# Patient Record
Sex: Female | Born: 1980 | Race: White | Hispanic: No | Marital: Married | Smoking: Former smoker
Health system: Southern US, Community
[De-identification: ages and names within clinical notes are randomized; demographics above are authoritative.]

## PROBLEM LIST (undated history)

## (undated) DIAGNOSIS — O09899 Supervision of other high risk pregnancies, unspecified trimester: Secondary | ICD-10-CM

## (undated) DIAGNOSIS — IMO0002 Reserved for concepts with insufficient information to code with codable children: Secondary | ICD-10-CM

## (undated) DIAGNOSIS — G4700 Insomnia, unspecified: Secondary | ICD-10-CM

## (undated) DIAGNOSIS — G8929 Other chronic pain: Secondary | ICD-10-CM

## (undated) DIAGNOSIS — R270 Ataxia, unspecified: Secondary | ICD-10-CM

## (undated) DIAGNOSIS — R2689 Other abnormalities of gait and mobility: Secondary | ICD-10-CM

## (undated) DIAGNOSIS — R1013 Epigastric pain: Secondary | ICD-10-CM

## (undated) DIAGNOSIS — Z8742 Personal history of other diseases of the female genital tract: Secondary | ICD-10-CM

## (undated) DIAGNOSIS — R55 Syncope and collapse: Secondary | ICD-10-CM

## (undated) DIAGNOSIS — F319 Bipolar disorder, unspecified: Secondary | ICD-10-CM

## (undated) DIAGNOSIS — F419 Anxiety disorder, unspecified: Secondary | ICD-10-CM

## (undated) DIAGNOSIS — R87619 Unspecified abnormal cytological findings in specimens from cervix uteri: Secondary | ICD-10-CM

## (undated) DIAGNOSIS — J45909 Unspecified asthma, uncomplicated: Secondary | ICD-10-CM

## (undated) DIAGNOSIS — R519 Headache, unspecified: Secondary | ICD-10-CM

## (undated) HISTORY — DX: Personal history of other diseases of the female genital tract: Z87.42

## (undated) HISTORY — DX: Other chronic pain: G89.29

## (undated) HISTORY — PX: LAPAROSCOPIC REPAIR AND REMOVAL OF GASTRIC BAND: SHX5919

## (undated) HISTORY — DX: Bipolar disorder, unspecified: F31.9

## (undated) HISTORY — DX: Other abnormalities of gait and mobility: R26.89

## (undated) HISTORY — PX: EYE SURGERY: SHX253

## (undated) HISTORY — DX: Insomnia, unspecified: G47.00

## (undated) HISTORY — PX: LAPAROSCOPIC GASTRIC BANDING: SHX1100

## (undated) HISTORY — DX: Epigastric pain: R10.13

## (undated) HISTORY — DX: Ataxia, unspecified: R27.0

## (undated) HISTORY — DX: Syncope and collapse: R55

---

## 2004-03-15 HISTORY — PX: HAND SURGERY: SHX662

## 2004-03-25 ENCOUNTER — Ambulatory Visit (HOSPITAL_COMMUNITY): Admission: RE | Admit: 2004-03-25 | Discharge: 2004-03-25 | Payer: Self-pay | Admitting: Orthopedic Surgery

## 2008-10-23 ENCOUNTER — Ambulatory Visit (HOSPITAL_COMMUNITY): Admission: RE | Admit: 2008-10-23 | Discharge: 2008-10-23 | Payer: Self-pay | Admitting: Obstetrics and Gynecology

## 2008-11-11 ENCOUNTER — Ambulatory Visit (HOSPITAL_COMMUNITY): Admission: RE | Admit: 2008-11-11 | Discharge: 2008-11-11 | Payer: Self-pay | Admitting: Obstetrics and Gynecology

## 2010-04-06 ENCOUNTER — Encounter: Payer: Self-pay | Admitting: Obstetrics and Gynecology

## 2010-07-31 NOTE — Op Note (Signed)
NAMESHAMONE, Monique Floyd             ACCOUNT NO.:  [id number]   MEDICAL RECORD NO.:  [id number]          PATIENT TYPE:  OIB   LOCATION:  NA                           FACILITY:  [facility]   PHYSICIAN:  Artist Pais. Weingold, M.D.DATE OF BIRTH:  1980-05-03   DATE OF PROCEDURE:  03/25/2004  DATE OF DISCHARGE:                                 OPERATIVE REPORT   PREOPERATIVE DIAGNOSIS:  Right hand multiple lacerations to the long, ring,  and small fingers.   POSTOPERATIVE DIAGNOSIS:  Right hand multiple lacerations to the long, ring,  and small fingers.   PROCEDURE:  1.  Right long finger microscopic ulnar digital nerve repair and ulnar      digital artery repair.  2.  Right ring finger, zone 2, flexor digitorum profundus repair with ulnar      and radial digital nerve repair, microscopically.  3.  Right small finger, zone 2, profundus and superficialis repairs as well      as ulnar digital nerve repair microscopically.   SURGEON:  Artist Pais. Mina Marble, M.D.   ASSISTANT:  Aura Fey. Bobbe Medico.   ANESTHESIA:  Ax block.   TOURNIQUET TIME:  One hour and 55 minutes.   COMPLICATIONS:  None.   DRAINS:  None.   OPERATIVE REPORT:  The patient was taken to the operating room with  induction of adequate regional anesthetic. The right upper extremity was  prepped and draped in usual sterile fashion. An Esmarch was used to  exsanguinate the limb. The tourniquet was inflated to 250 mmHg at this point  in time. The long, ring, and small fingers were approached after we had  previously placed sutures in the emergency room. All the incisions were  extended proximally and distally at the mid and lateral type fashion. Flaps  were raised accordingly. Dissection was carried down to the flexor sheath on  the long finger. There was an intact flexor sheath. There was a complete  laceration of the ulnar neurovascular bundle. On the ring finger there was a  profundus laceration in zone 2 as well as complete  laceration of the radial  and ulnar neurovascular bundles. In the little finger there was zone 2  profundus and superficialis lacerations as well as the ulnar neurovascular  bundle. At this point in time the profundus and superficialis tendons of the  small finger were repaired using 3-0 double-armed Ethibond in a Tajima-type  suture, followed by 6-0 Prolene in a tendinous type suture for both the  profundus and superficialis slips. The ring finger profundus tendon was  repaired in a similar fashion. Once this was done the microscope was brought  onto the field, the microscope was then used to repair the ulnar digital  nerve of the little finger, the ulnar and radial digital nerves of the ring  finger, and the ulnar digital nerve and artery on the long finger. The  microscope was removed from the field. The wounds were thoroughly irrigated.  The skin incisions were then closed loosely with 5-0 nylon. The patient  was then placed in a sterile dressing of Xeroform, 4x4's, fluffs, a  compression  wrap, and a dorsal extension block splint with the wrist flexed  to 40 degrees, PIP joints to 30 degrees each. The patient tolerated the  procedure well and went to the recovery room in stable fashion.      MAW/MEDQ  D:  03/25/2004  T:  03/25/2004  Job:  [id number]

## 2011-09-01 ENCOUNTER — Other Ambulatory Visit: Payer: Self-pay

## 2011-09-08 ENCOUNTER — Other Ambulatory Visit (HOSPITAL_COMMUNITY): Payer: Self-pay | Admitting: Obstetrics and Gynecology

## 2011-09-08 DIAGNOSIS — Z3689 Encounter for other specified antenatal screening: Secondary | ICD-10-CM

## 2011-11-01 ENCOUNTER — Encounter (HOSPITAL_COMMUNITY): Payer: Self-pay | Admitting: Obstetrics and Gynecology

## 2011-11-08 ENCOUNTER — Encounter (HOSPITAL_COMMUNITY): Payer: Self-pay

## 2011-11-08 ENCOUNTER — Ambulatory Visit (HOSPITAL_COMMUNITY)
Admission: RE | Admit: 2011-11-08 | Discharge: 2011-11-08 | Disposition: A | Discharge status: Home / Self Care | Payer: Medicaid Other | Source: Ambulatory Visit | Attending: Obstetrics and Gynecology | Admitting: Obstetrics and Gynecology

## 2011-11-08 VITALS — BP 134/85 | HR 89 | Wt 295.0 lb

## 2011-11-08 DIAGNOSIS — Z8751 Personal history of pre-term labor: Secondary | ICD-10-CM | POA: Insufficient documentation

## 2011-11-08 DIAGNOSIS — Z3689 Encounter for other specified antenatal screening: Secondary | ICD-10-CM

## 2011-11-08 DIAGNOSIS — E669 Obesity, unspecified: Secondary | ICD-10-CM | POA: Insufficient documentation

## 2011-11-08 DIAGNOSIS — O34219 Maternal care for unspecified type scar from previous cesarean delivery: Secondary | ICD-10-CM | POA: Insufficient documentation

## 2011-11-08 DIAGNOSIS — O10019 Pre-existing essential hypertension complicating pregnancy, unspecified trimester: Secondary | ICD-10-CM | POA: Insufficient documentation

## 2011-11-08 DIAGNOSIS — Z363 Encounter for antenatal screening for malformations: Secondary | ICD-10-CM | POA: Insufficient documentation

## 2011-11-08 DIAGNOSIS — O358XX0 Maternal care for other (suspected) fetal abnormality and damage, not applicable or unspecified: Secondary | ICD-10-CM | POA: Insufficient documentation

## 2011-11-08 DIAGNOSIS — Z1389 Encounter for screening for other disorder: Secondary | ICD-10-CM | POA: Insufficient documentation

## 2011-11-08 HISTORY — DX: Unspecified asthma, uncomplicated: J45.909

## 2011-11-08 HISTORY — DX: Unspecified abnormal cytological findings in specimens from cervix uteri: R87.619

## 2011-11-08 HISTORY — DX: Reserved for concepts with insufficient information to code with codable children: IMO0002

## 2011-11-08 HISTORY — DX: Anxiety disorder, unspecified: F41.9

## 2011-11-08 HISTORY — DX: Supervision of other high risk pregnancies, unspecified trimester: O09.899

## 2011-11-08 NOTE — Progress Notes (Signed)
Monique Floyd  was seen today for an ultrasound appointment.  See full report in [facility].  Alpha Gula, MD  Single IUP at 20 0/7 weeks Normal detailed fetal anatomic survey No markers associated with aneuploidy noted Normal amniotic fluid volume  Recommend follow up ultrasound in 6 weeks for interval growth.

## 2011-12-20 ENCOUNTER — Ambulatory Visit (HOSPITAL_COMMUNITY)
Admission: RE | Admit: 2011-12-20 | Discharge: 2011-12-20 | Disposition: A | Discharge status: Home / Self Care | Payer: Medicaid Other | Source: Ambulatory Visit | Attending: Obstetrics and Gynecology | Admitting: Obstetrics and Gynecology

## 2011-12-20 VITALS — BP 141/84 | HR 100 | Wt 291.0 lb

## 2011-12-20 DIAGNOSIS — Z8751 Personal history of pre-term labor: Secondary | ICD-10-CM | POA: Insufficient documentation

## 2011-12-20 DIAGNOSIS — E669 Obesity, unspecified: Secondary | ICD-10-CM | POA: Insufficient documentation

## 2011-12-20 DIAGNOSIS — O09299 Supervision of pregnancy with other poor reproductive or obstetric history, unspecified trimester: Secondary | ICD-10-CM | POA: Insufficient documentation

## 2011-12-20 DIAGNOSIS — O34219 Maternal care for unspecified type scar from previous cesarean delivery: Secondary | ICD-10-CM | POA: Insufficient documentation

## 2011-12-20 DIAGNOSIS — O99210 Obesity complicating pregnancy, unspecified trimester: Secondary | ICD-10-CM | POA: Insufficient documentation

## 2011-12-20 DIAGNOSIS — Z3689 Encounter for other specified antenatal screening: Secondary | ICD-10-CM

## 2011-12-20 DIAGNOSIS — O10019 Pre-existing essential hypertension complicating pregnancy, unspecified trimester: Secondary | ICD-10-CM

## 2012-01-17 ENCOUNTER — Ambulatory Visit (HOSPITAL_COMMUNITY)
Admission: RE | Admit: 2012-01-17 | Discharge: 2012-01-17 | Disposition: A | Discharge status: Home / Self Care | Payer: Medicaid Other | Source: Ambulatory Visit | Attending: Obstetrics and Gynecology | Admitting: Obstetrics and Gynecology

## 2012-01-17 ENCOUNTER — Encounter (HOSPITAL_COMMUNITY): Payer: Self-pay

## 2012-01-17 DIAGNOSIS — E669 Obesity, unspecified: Secondary | ICD-10-CM | POA: Insufficient documentation

## 2012-01-17 DIAGNOSIS — O09299 Supervision of pregnancy with other poor reproductive or obstetric history, unspecified trimester: Secondary | ICD-10-CM | POA: Insufficient documentation

## 2012-01-17 DIAGNOSIS — O10019 Pre-existing essential hypertension complicating pregnancy, unspecified trimester: Secondary | ICD-10-CM

## 2012-01-17 DIAGNOSIS — Z8751 Personal history of pre-term labor: Secondary | ICD-10-CM | POA: Insufficient documentation

## 2012-01-17 DIAGNOSIS — O34219 Maternal care for unspecified type scar from previous cesarean delivery: Secondary | ICD-10-CM | POA: Insufficient documentation

## 2012-01-17 NOTE — Progress Notes (Signed)
[facility] Ultrasound  Indication: 31 yr old [id number] at [redacted]w[redacted]d with chronic hypertension for follow up fetal growth ultrasound.  Findings: 1. Single intrauterine pregnancy. 2. Estimated fetal weight is in the 88th%. 3. Anterior, fundal placenta without evidence of previa. 4. Normal amniotic fluid index; although increased for gestational age. 5. The limited anatomy survey is normal. Any anatomy not evaluated on today's exam was evaluated on the previous exam.  Recommendations: 1. Appropriate fetal growth; although growth and abdominal circumference are accelerated. 2. Hypertension: - previously counseled - well controlled on aldomet - recommend fetal growth every 4 weeks - recommend antenatal testing with either twice weekly nonstress tests and weekly amniotic fluid index or weekly biophysical profiles starting at 32 weeks - recommend close surveillance for the development of signs/symptoms of preeclampsia - recommend delivery at 38-[redacted] weeks gestation 3. Slightly increased AFI: - recommend repeat AFI in 1 week 4. Recommend fetal kick counts.  Eulis Foster, MD

## 2012-01-24 ENCOUNTER — Ambulatory Visit (HOSPITAL_COMMUNITY)
Admission: RE | Admit: 2012-01-24 | Discharge: 2012-01-24 | Disposition: A | Discharge status: Home / Self Care | Payer: Medicaid Other | Source: Ambulatory Visit | Attending: Obstetrics and Gynecology | Admitting: Obstetrics and Gynecology

## 2012-01-24 DIAGNOSIS — O09299 Supervision of pregnancy with other poor reproductive or obstetric history, unspecified trimester: Secondary | ICD-10-CM | POA: Insufficient documentation

## 2012-01-24 DIAGNOSIS — Z8751 Personal history of pre-term labor: Secondary | ICD-10-CM | POA: Insufficient documentation

## 2012-01-24 DIAGNOSIS — O10019 Pre-existing essential hypertension complicating pregnancy, unspecified trimester: Secondary | ICD-10-CM

## 2012-01-24 DIAGNOSIS — O34219 Maternal care for unspecified type scar from previous cesarean delivery: Secondary | ICD-10-CM | POA: Insufficient documentation

## 2012-01-24 DIAGNOSIS — E669 Obesity, unspecified: Secondary | ICD-10-CM | POA: Insufficient documentation

## 2012-01-24 DIAGNOSIS — O99210 Obesity complicating pregnancy, unspecified trimester: Secondary | ICD-10-CM | POA: Insufficient documentation

## 2012-01-24 NOTE — Progress Notes (Signed)
Monique Floyd  was seen today for an ultrasound appointment.  See full report in [facility].  Single IUP at 31 0/7 weeks Limited ultrasound performed for AFI only Mild polyhydramnios noted with AFI of 27 cm.  Recommend initiating 2x weekly NSTs with weekly AFI beginning at 32 weeks. Follow up growth scan in 4 weeks.  Alpha Gula, MD

## 2012-02-14 ENCOUNTER — Ambulatory Visit (HOSPITAL_COMMUNITY)
Admission: RE | Admit: 2012-02-14 | Discharge: 2012-02-14 | Disposition: A | Discharge status: Home / Self Care | Payer: Medicaid Other | Source: Ambulatory Visit | Attending: Obstetrics and Gynecology | Admitting: Obstetrics and Gynecology

## 2012-02-14 ENCOUNTER — Other Ambulatory Visit (HOSPITAL_COMMUNITY): Payer: Self-pay | Admitting: Maternal and Fetal Medicine

## 2012-02-14 DIAGNOSIS — O09299 Supervision of pregnancy with other poor reproductive or obstetric history, unspecified trimester: Secondary | ICD-10-CM | POA: Insufficient documentation

## 2012-02-14 DIAGNOSIS — E669 Obesity, unspecified: Secondary | ICD-10-CM | POA: Insufficient documentation

## 2012-02-14 DIAGNOSIS — O409XX0 Polyhydramnios, unspecified trimester, not applicable or unspecified: Secondary | ICD-10-CM | POA: Insufficient documentation

## 2012-02-14 DIAGNOSIS — Z8751 Personal history of pre-term labor: Secondary | ICD-10-CM | POA: Insufficient documentation

## 2012-02-14 DIAGNOSIS — O99210 Obesity complicating pregnancy, unspecified trimester: Secondary | ICD-10-CM | POA: Insufficient documentation

## 2012-02-14 DIAGNOSIS — O10019 Pre-existing essential hypertension complicating pregnancy, unspecified trimester: Secondary | ICD-10-CM

## 2012-02-14 DIAGNOSIS — O34219 Maternal care for unspecified type scar from previous cesarean delivery: Secondary | ICD-10-CM | POA: Insufficient documentation

## 2012-02-14 NOTE — Progress Notes (Signed)
Monique Floyd  was seen today for an ultrasound appointment.  See full report in [facility].  Alpha Gula, MD

## 2012-03-07 ENCOUNTER — Ambulatory Visit (HOSPITAL_COMMUNITY)
Admission: RE | Admit: 2012-03-07 | Discharge: 2012-03-07 | Disposition: A | Discharge status: Home / Self Care | Payer: Medicaid Other | Source: Ambulatory Visit | Attending: Obstetrics and Gynecology | Admitting: Obstetrics and Gynecology

## 2012-03-07 ENCOUNTER — Encounter (HOSPITAL_COMMUNITY): Payer: Self-pay

## 2012-03-07 DIAGNOSIS — E669 Obesity, unspecified: Secondary | ICD-10-CM | POA: Insufficient documentation

## 2012-03-07 DIAGNOSIS — Z8751 Personal history of pre-term labor: Secondary | ICD-10-CM | POA: Insufficient documentation

## 2012-03-07 DIAGNOSIS — O10019 Pre-existing essential hypertension complicating pregnancy, unspecified trimester: Secondary | ICD-10-CM

## 2012-03-07 DIAGNOSIS — O3660X0 Maternal care for excessive fetal growth, unspecified trimester, not applicable or unspecified: Secondary | ICD-10-CM | POA: Insufficient documentation

## 2012-03-07 DIAGNOSIS — O09299 Supervision of pregnancy with other poor reproductive or obstetric history, unspecified trimester: Secondary | ICD-10-CM | POA: Insufficient documentation

## 2012-03-07 DIAGNOSIS — O34219 Maternal care for unspecified type scar from previous cesarean delivery: Secondary | ICD-10-CM | POA: Insufficient documentation

## 2014-01-14 ENCOUNTER — Encounter (HOSPITAL_COMMUNITY): Payer: Self-pay

## 2017-03-15 HISTORY — PX: CHOLECYSTECTOMY: SHX55

## 2017-07-07 HISTORY — DX: Morbid (severe) obesity due to excess calories: E66.01

## 2018-01-24 DIAGNOSIS — Z9884 Bariatric surgery status: Secondary | ICD-10-CM | POA: Insufficient documentation

## 2018-01-24 DIAGNOSIS — Z6841 Body Mass Index (BMI) 40.0 and over, adult: Secondary | ICD-10-CM

## 2018-01-24 HISTORY — DX: Bariatric surgery status: Z98.84

## 2018-01-24 HISTORY — DX: Body Mass Index (BMI) 40.0 and over, adult: Z684

## 2020-07-21 ENCOUNTER — Emergency Department (HOSPITAL_COMMUNITY): Payer: BC Managed Care – PPO

## 2020-07-21 ENCOUNTER — Inpatient Hospital Stay (HOSPITAL_COMMUNITY)
Admission: EM | Admit: 2020-07-21 | Discharge: 2020-07-31 | DRG: 392 | Disposition: A | Discharge status: Home / Self Care | Payer: BC Managed Care – PPO | Attending: Internal Medicine | Admitting: Internal Medicine

## 2020-07-21 DIAGNOSIS — Z91041 Radiographic dye allergy status: Secondary | ICD-10-CM

## 2020-07-21 DIAGNOSIS — Z885 Allergy status to narcotic agent status: Secondary | ICD-10-CM

## 2020-07-21 DIAGNOSIS — K589 Irritable bowel syndrome without diarrhea: Secondary | ICD-10-CM | POA: Diagnosis not present

## 2020-07-21 DIAGNOSIS — R1013 Epigastric pain: Secondary | ICD-10-CM | POA: Diagnosis not present

## 2020-07-21 DIAGNOSIS — Z20822 Contact with and (suspected) exposure to covid-19: Secondary | ICD-10-CM | POA: Diagnosis present

## 2020-07-21 DIAGNOSIS — Z91030 Bee allergy status: Secondary | ICD-10-CM

## 2020-07-21 DIAGNOSIS — Z9884 Bariatric surgery status: Secondary | ICD-10-CM

## 2020-07-21 DIAGNOSIS — N390 Urinary tract infection, site not specified: Secondary | ICD-10-CM | POA: Diagnosis present

## 2020-07-21 DIAGNOSIS — R7989 Other specified abnormal findings of blood chemistry: Secondary | ICD-10-CM

## 2020-07-21 DIAGNOSIS — Z800 Family history of malignant neoplasm of digestive organs: Secondary | ICD-10-CM

## 2020-07-21 DIAGNOSIS — R109 Unspecified abdominal pain: Principal | ICD-10-CM | POA: Diagnosis present

## 2020-07-21 DIAGNOSIS — F419 Anxiety disorder, unspecified: Secondary | ICD-10-CM | POA: Diagnosis present

## 2020-07-21 DIAGNOSIS — Z87891 Personal history of nicotine dependence: Secondary | ICD-10-CM

## 2020-07-21 DIAGNOSIS — G4733 Obstructive sleep apnea (adult) (pediatric): Secondary | ICD-10-CM | POA: Diagnosis present

## 2020-07-21 DIAGNOSIS — R7401 Elevation of levels of liver transaminase levels: Secondary | ICD-10-CM | POA: Diagnosis present

## 2020-07-21 DIAGNOSIS — Z9049 Acquired absence of other specified parts of digestive tract: Secondary | ICD-10-CM

## 2020-07-21 DIAGNOSIS — M170 Bilateral primary osteoarthritis of knee: Secondary | ICD-10-CM | POA: Diagnosis present

## 2020-07-21 DIAGNOSIS — E876 Hypokalemia: Secondary | ICD-10-CM | POA: Diagnosis present

## 2020-07-21 DIAGNOSIS — K921 Melena: Secondary | ICD-10-CM | POA: Diagnosis not present

## 2020-07-21 DIAGNOSIS — Z79899 Other long term (current) drug therapy: Secondary | ICD-10-CM

## 2020-07-21 HISTORY — DX: Unspecified abdominal pain: R10.9

## 2020-07-21 LAB — COMPREHENSIVE METABOLIC PANEL
ALT: 34 U/L (ref 0–44)
AST: 39 U/L (ref 15–41)
Albumin: 4.2 g/dL (ref 3.5–5.0)
Alkaline Phosphatase: 77 U/L (ref 38–126)
Anion gap: 9 (ref 5–15)
BUN: 8 mg/dL (ref 6–20)
CO2: 22 mmol/L (ref 22–32)
Calcium: 9 mg/dL (ref 8.9–10.3)
Chloride: 109 mmol/L (ref 98–111)
Creatinine, Ser: 0.64 mg/dL (ref 0.44–1.00)
GFR, Estimated: >60 mL/min (ref >60)
Glucose, Bld: 89 mg/dL (ref 70–99)
Potassium: 4.3 mmol/L (ref 3.5–5.1)
Sodium: 140 mmol/L (ref 135–145)
Total Bilirubin: 1.3 mg/dL — ABNORMAL HIGH (ref 0.3–1.2)
Total Protein: 6.7 g/dL (ref 6.5–8.1)

## 2020-07-21 LAB — CBC WITH DIFFERENTIAL/PLATELET
Abs Immature Granulocytes: 0.02 10*3/uL (ref 0.00–0.07)
Basophils Absolute: 0.1 10*3/uL (ref 0.0–0.1)
Basophils Relative: 1 %
Eosinophils Absolute: 0.1 10*3/uL (ref 0.0–0.5)
Eosinophils Relative: 1 %
HCT: 47.7 % — ABNORMAL HIGH (ref 36.0–46.0)
Hemoglobin: 15.6 g/dL — ABNORMAL HIGH (ref 12.0–15.0)
Immature Granulocytes: 0 %
Lymphocytes Relative: 32 %
Lymphs Abs: 2.5 10*3/uL (ref 0.7–4.0)
MCH: 32.5 pg (ref 26.0–34.0)
MCHC: 32.7 g/dL (ref 30.0–36.0)
MCV: 99.4 fL (ref 80.0–100.0)
Monocytes Absolute: 0.6 10*3/uL (ref 0.1–1.0)
Monocytes Relative: 8 %
Neutro Abs: 4.5 10*3/uL (ref 1.7–7.7)
Neutrophils Relative %: 58 %
Platelets: 263 10*3/uL (ref 150–400)
RBC: 4.8 MIL/uL (ref 3.87–5.11)
RDW: 12.6 % (ref 11.5–15.5)
WBC: 7.7 10*3/uL (ref 4.0–10.5)
nRBC: 0 % (ref 0.0–0.2)

## 2020-07-21 LAB — URINALYSIS, ROUTINE W REFLEX MICROSCOPIC
Bilirubin Urine: NEGATIVE
Glucose, UA: NEGATIVE mg/dL
Hgb urine dipstick: NEGATIVE
Ketones, ur: 20 mg/dL — AB
Nitrite: NEGATIVE
Protein, ur: NEGATIVE mg/dL
Specific Gravity, Urine: 1.02 (ref 1.005–1.030)
pH: 5 (ref 5.0–8.0)

## 2020-07-21 LAB — PREGNANCY, URINE: Preg Test, Ur: NEGATIVE

## 2020-07-21 LAB — TROPONIN I (HIGH SENSITIVITY): Troponin I (High Sensitivity): 4 ng/L (ref <18)

## 2020-07-21 LAB — RESP PANEL BY RT-PCR (FLU A&B, COVID) ARPGX2
Influenza A by PCR: NEGATIVE
Influenza B by PCR: NEGATIVE
SARS Coronavirus 2 by RT PCR: NEGATIVE

## 2020-07-21 LAB — LACTIC ACID, PLASMA: Lactic Acid, Venous: 0.7 mmol/L (ref 0.5–1.9)

## 2020-07-21 LAB — LIPASE, BLOOD: Lipase: 32 U/L (ref 11–51)

## 2020-07-21 MED ORDER — HYDROMORPHONE HCL 1 MG/ML IJ SOLN
1.0000 mg | Freq: Once | INTRAMUSCULAR | Status: AC
Start: 2020-07-21 — End: 2020-07-21
  Administered 2020-07-21: 1 mg via INTRAVENOUS
  Filled 2020-07-21: qty 1

## 2020-07-21 MED ORDER — MELATONIN 3 MG PO TABS
3.0000 mg | ORAL_TABLET | Freq: Every evening | ORAL | Status: DC | PRN
  Administered 2020-07-21 – 2020-07-27 (×6): 3 mg via ORAL
  Filled 2020-07-21 (×6): qty 1

## 2020-07-21 MED ORDER — HYDROMORPHONE HCL 1 MG/ML IJ SOLN
1.0000 mg | INTRAMUSCULAR | Status: DC | PRN
  Administered 2020-07-21: 1 mg via INTRAVENOUS
  Filled 2020-07-21 (×2): qty 1

## 2020-07-21 MED ORDER — LACTATED RINGERS IV BOLUS
1000.0000 mL | Freq: Once | INTRAVENOUS | Status: AC
  Administered 2020-07-21: 1000 mL via INTRAVENOUS

## 2020-07-21 MED ORDER — SODIUM CHLORIDE 0.9 % IV BOLUS
1000.0000 mL | Freq: Once | INTRAVENOUS | Status: AC
  Administered 2020-07-21: 1000 mL via INTRAVENOUS

## 2020-07-21 MED ORDER — DIPHENHYDRAMINE HCL 25 MG PO CAPS: 50.0000 mg | ORAL_CAPSULE | Freq: Once | ORAL | Status: DC

## 2020-07-21 MED ORDER — SENNOSIDES-DOCUSATE SODIUM 8.6-50 MG PO TABS
2.0000 | ORAL_TABLET | Freq: Every day | ORAL | Status: DC
  Administered 2020-07-21 – 2020-07-30 (×9): 2 via ORAL
  Filled 2020-07-21 (×10): qty 2

## 2020-07-21 MED ORDER — CEPHALEXIN 500 MG PO CAPS
500.0000 mg | ORAL_CAPSULE | Freq: Three times a day (TID) | ORAL | Status: AC
  Administered 2020-07-21 – 2020-07-26 (×15): 500 mg via ORAL
  Filled 2020-07-21 (×13): qty 1
  Filled 2020-07-21: qty 2
  Filled 2020-07-21: qty 1

## 2020-07-21 MED ORDER — MORPHINE SULFATE (PF) 4 MG/ML IV SOLN
4.0000 mg | Freq: Once | INTRAVENOUS | Status: AC
  Administered 2020-07-21: 4 mg via INTRAVENOUS
  Filled 2020-07-21: qty 1

## 2020-07-21 MED ORDER — PANTOPRAZOLE SODIUM 40 MG IV SOLR
40.0000 mg | Freq: Two times a day (BID) | INTRAVENOUS | Status: DC
  Administered 2020-07-21 – 2020-07-23 (×5): 40 mg via INTRAVENOUS
  Filled 2020-07-21 (×5): qty 40

## 2020-07-21 MED ORDER — ACETAMINOPHEN 325 MG PO TABS
650.0000 mg | ORAL_TABLET | Freq: Four times a day (QID) | ORAL | Status: DC | PRN
  Administered 2020-07-23 – 2020-07-29 (×14): 650 mg via ORAL
  Filled 2020-07-21 (×15): qty 2

## 2020-07-21 MED ORDER — DICYCLOMINE HCL 10 MG PO CAPS
10.0000 mg | ORAL_CAPSULE | Freq: Three times a day (TID) | ORAL | Status: AC
  Administered 2020-07-21 – 2020-07-23 (×7): 10 mg via ORAL
  Filled 2020-07-21 (×6): qty 1

## 2020-07-21 MED ORDER — LIDOCAINE VISCOUS HCL 2 % MT SOLN
15.0000 mL | Freq: Once | OROMUCOSAL | Status: AC
  Administered 2020-07-21: 15 mL via ORAL
  Filled 2020-07-21: qty 15

## 2020-07-21 MED ORDER — HYDROCORTISONE NA SUCCINATE PF 250 MG IJ SOLR
200.0000 mg | Freq: Once | INTRAMUSCULAR | Status: AC
  Administered 2020-07-21: 200 mg via INTRAVENOUS
  Filled 2020-07-21: qty 200

## 2020-07-21 MED ORDER — ENOXAPARIN SODIUM 40 MG/0.4ML IJ SOSY
40.0000 mg | PREFILLED_SYRINGE | INTRAMUSCULAR | Status: DC
  Administered 2020-07-21 – 2020-07-22 (×2): 40 mg via SUBCUTANEOUS
  Filled 2020-07-21 (×2): qty 0.4

## 2020-07-21 MED ORDER — OXYCODONE HCL 5 MG PO TABS
5.0000 mg | ORAL_TABLET | ORAL | Status: DC | PRN
  Administered 2020-07-21 – 2020-07-27 (×30): 5 mg via ORAL
  Filled 2020-07-21 (×33): qty 1

## 2020-07-21 MED ORDER — DIPHENHYDRAMINE HCL 50 MG/ML IJ SOLN: 50.0000 mg | Freq: Once | INTRAMUSCULAR | Status: DC

## 2020-07-21 MED ORDER — ONDANSETRON HCL 4 MG/2ML IJ SOLN
4.0000 mg | Freq: Four times a day (QID) | INTRAMUSCULAR | Status: DC | PRN
  Administered 2020-07-21 – 2020-07-25 (×3): 4 mg via INTRAVENOUS
  Filled 2020-07-21 (×4): qty 2

## 2020-07-21 MED ORDER — ALUM & MAG HYDROXIDE-SIMETH 200-200-20 MG/5ML PO SUSP
30.0000 mL | Freq: Once | ORAL | Status: AC
  Administered 2020-07-21: 30 mL via ORAL
  Filled 2020-07-21: qty 30

## 2020-07-21 MED ORDER — IPRATROPIUM-ALBUTEROL 0.5-2.5 (3) MG/3ML IN SOLN
3.0000 mL | Freq: Once | RESPIRATORY_TRACT | Status: AC
  Administered 2020-07-21: 3 mL via RESPIRATORY_TRACT
  Filled 2020-07-21: qty 3

## 2020-07-21 MED ORDER — PANTOPRAZOLE SODIUM 40 MG IV SOLR
40.0000 mg | Freq: Once | INTRAVENOUS | Status: AC
  Administered 2020-07-21: 40 mg via INTRAVENOUS
  Filled 2020-07-21: qty 40

## 2020-07-21 MED ORDER — HYDROMORPHONE HCL 1 MG/ML IJ SOLN
1.0000 mg | Freq: Once | INTRAMUSCULAR | Status: AC
  Administered 2020-07-21: 1 mg via INTRAVENOUS
  Filled 2020-07-21: qty 1

## 2020-07-21 MED ORDER — LACTATED RINGERS IV SOLN: INTRAVENOUS | Status: AC

## 2020-07-21 MED ORDER — ONDANSETRON HCL 4 MG/2ML IJ SOLN
4.0000 mg | Freq: Once | INTRAMUSCULAR | Status: AC
Start: 2020-07-21 — End: 2020-07-21
  Administered 2020-07-21: 4 mg via INTRAVENOUS
  Filled 2020-07-21: qty 2

## 2020-07-21 MED ORDER — FENTANYL CITRATE (PF) 100 MCG/2ML IJ SOLN
100.0000 ug | Freq: Once | INTRAMUSCULAR | Status: DC
Start: 2020-07-21 — End: 2020-07-21

## 2020-07-21 NOTE — ED Provider Notes (Signed)
[facility] EMERGENCY DEPARTMENT Provider Note   CSN: [id number] Arrival date & time: 07/21/20  0845     History Chief Complaint  Patient presents with  . Abdominal Pain    Monique Floyd is a 40 y.o. female.  40 year old female with extensive surgical history including Laparoscopic Biliopancreatic Diversion with Duodenal Switch, laparoscopic removal of lap band and port, laparoscopic cholecystectomy, laparoscopic repair of DI leak and graham patch, as well as asthma, who p/w abd pain.  This morning, patient suddenly began having generalized abdominal pain and burning sensation associated with some pain radiating to bilateral flanks and back.  She was in her usual state of health yesterday and denies any recent illness.  No associated nausea, vomiting, or change in bowel movements.  No fevers.  She denies any chest pain or shortness of breath.  EMS gave her 100 mcg of fentanyl in route.  She has not had any surgical problems recently and does not take any medications daily.  She denies alcohol or heavy NSAID use.    The history is provided by the patient.  Abdominal Pain      Past Medical History:  Diagnosis Date  . Abnormal Pap smear   . Anxiety   . Asthma   . Prior pregnancy complicated by PIH, antepartum     There are no problems to display for this patient.   Past Surgical History:  Procedure Laterality Date  . CESAREAN SECTION       OB History    Gravida  6   Para  4   Term  3   Preterm  1   AB  1   Living  4     SAB  1   IAB  0   Ectopic  0   Multiple  1   Live Births  5           No family history on file.  Social History   Tobacco Use  . Smoking status: Former Smoker    Quit date: 06/14/2011    Years since quitting: 9.1    Home Medications Prior to Admission medications   Medication Sig Start Date End Date Taking? Authorizing Provider  methyldopa (ALDOMET) 500 MG tablet Take 500 mg by mouth 2 (two) times  daily.    [provider]  Prenatal Vit w/Fe-Methylfol-FA (PNV PO) Take by mouth daily.    [provider]    Allergies    Contrast media [iodinated diagnostic agents] and Codeine  Review of Systems   Review of Systems  Gastrointestinal: Positive for abdominal pain.   All other systems reviewed and are negative except that which was mentioned in HPI  Physical Exam Updated Vital Signs BP (!) 96/51   Pulse 63   Temp 98.6 F (37 C) (Oral)   Resp (!) 22   Ht 5\' 1"  (1.549 m)   Wt 65.8 kg   SpO2 97%   BMI 27.40 kg/m   Physical Exam Vitals and nursing note reviewed.  Constitutional:      General: She is not in acute distress.    Appearance: Normal appearance.     Comments: Uncomfortable, in mild distress due to pain  HENT:     Head: Normocephalic and atraumatic.  Eyes:     Conjunctiva/sclera: Conjunctivae normal.  Cardiovascular:     Rate and Rhythm: Normal rate and regular rhythm.     Heart sounds: Normal heart sounds. No murmur heard.   Pulmonary:  Effort: Pulmonary effort is normal.     Comments: Expiratory wheezes bilaterally Abdominal:     General: Abdomen is flat. Bowel sounds are normal. There is no distension.     Palpations: Abdomen is soft.     Tenderness: There is generalized abdominal tenderness.     Comments: Generalized abdominal tenderness worse in upper abdomen, no peritonitis or rigidity  Musculoskeletal:     Right lower leg: No edema.     Left lower leg: No edema.  Skin:    General: Skin is warm and dry.  Neurological:     Mental Status: She is alert and oriented to person, place, and time.     Comments: fluent  Psychiatric:        Mood and Affect: Mood is anxious.        Behavior: Behavior normal.     ED Results / Procedures / Treatments   Labs (all labs ordered are listed, but only abnormal results are displayed) Labs Reviewed  COMPREHENSIVE METABOLIC PANEL - Abnormal; Notable for the following components:       Result Value   Total Bilirubin 1.3 (*)    All other components within normal limits  CBC WITH DIFFERENTIAL/PLATELET - Abnormal; Notable for the following components:   Hemoglobin 15.6 (*)    HCT 47.7 (*)    All other components within normal limits  URINALYSIS, ROUTINE W REFLEX MICROSCOPIC - Abnormal; Notable for the following components:   Color, Urine AMBER (*)    APPearance HAZY (*)    Ketones, ur 20 (*)    Leukocytes,Ua MODERATE (*)    Bacteria, UA RARE (*)    All other components within normal limits  URINE CULTURE  RESP PANEL BY RT-PCR (FLU A&B, COVID) ARPGX2  LIPASE, BLOOD  PREGNANCY, URINE  LACTIC ACID, PLASMA  TROPONIN I (HIGH SENSITIVITY)    EKG None  Radiology CT Abdomen Pelvis Wo Contrast  Result Date: 07/21/2020 CLINICAL DATA:  Abdominal pain. Patient reports a history of stones. History of a cholecystectomy. EXAM: CT ABDOMEN AND PELVIS WITHOUT CONTRAST TECHNIQUE: Multidetector CT imaging of the abdomen and pelvis was performed following the standard protocol without IV contrast. COMPARISON:  None. FINDINGS: Lower chest: Clear lung bases.  Heart normal in size. Hepatobiliary: Liver normal in overall size attenuation. There is a focal area of hypoattenuation, 3 cm in size, inferior aspect of segment 4 B, likely an area of focal fat. There is no mass effect from this. No other liver lesions. Status post cholecystectomy. No bile duct dilation. Pancreas: Choose 1 Spleen: Normal in size without focal abnormality. Adrenals/Urinary Tract: Adrenal glands are unremarkable. Kidneys are normal, without renal calculi, focal lesion, or hydronephrosis. Bladder is unremarkable. 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 No stomach wall thickening or inflammation. Small bowel and colon are normal in caliber. No wall thickening. No inflammatory changes. Normal appendix visualized. Vascular/Lymphatic: Mild aortic  atherosclerosis. No enlarged lymph nodes. Reproductive: Uterus and bilateral adnexa are unremarkable. Other: No abdominal wall hernia or abnormality. No abdominopelvic ascites. Musculoskeletal: No fracture or acute finding.  No bone lesion. IMPRESSION: 1. No acute findings within the abdomen or pelvis. 2. Changes consistent with previous gastric bypass surgery as well as a prior cholecystectomy. 3. 3 cm low-attenuation area in the inferior aspect the liver, segment 4 B, likely due to focal fat. This could reflect the sequela from previous gallbladder surgery. Consider further assessment with liver ultrasound or, preferably, liver MRI without  and with contrast. 4. Mild aortic atherosclerosis. Electronically Signed   By: Amie Portland M.D.   On: 07/21/2020 12:01    Procedures Procedures   Medications Ordered in ED Medications  ondansetron (ZOFRAN) injection 4 mg (4 mg Intravenous Given 07/21/20 1008)  morphine 4 MG/ML injection 4 mg (4 mg Intravenous Given 07/21/20 1008)  lactated ringers bolus 1,000 mL (0 mLs Intravenous Stopped 07/21/20 1107)  ipratropium-albuterol (DUONEB) 0.5-2.5 (3) MG/3ML nebulizer solution 3 mL (3 mLs Nebulization Given 07/21/20 1106)  HYDROmorphone (DILAUDID) injection 1 mg (1 mg Intravenous Given 07/21/20 1102)  HYDROmorphone (DILAUDID) injection 1 mg (1 mg Intravenous Given 07/21/20 1204)  HYDROmorphone (DILAUDID) injection 1 mg (1 mg Intravenous Given 07/21/20 1242)  hydrocortisone sodium succinate (SOLU-CORTEF) injection 200 mg (200 mg Intravenous Given 07/21/20 1258)  sodium chloride 0.9 % bolus 1,000 mL (1,000 mLs Intravenous New Bag/Given 07/21/20 1334)    ED Course  I have reviewed the triage vital signs and the nursing notes.  Pertinent labs & imaging results that were available during my care of the patient were reviewed by me and considered in my medical decision making (see chart for details).    MDM Rules/Calculators/A&P                          Patient was in mild  distress due to pain on initial exam.  Vital signs reassuring.  She did have some wheezing on exam, denies any symptoms suggestive of asthma exacerbation but does note some seasonal allergy symptoms recently.  Gave pain medicine as well as DuoNeb. Labwork including LFTs and lipase is normal.  Because of her extensive surgical history, differential is broad and includes bowel obstruction, anastomotic leak, pancreatitis.  Obtained CT abd/pelvis without contrast as pt reports anaphylactic allergy to contrast. CT with no acute findings to explain sx. Added EKG and trop to screen for ACS; trop and EKG reassuring. PT has continued to have severe pain requiring multiple rounds of dilaudid. Concern for possible dissection given radiation to back, sudden onset, and persistent pain. Radiologist recommended MRA with contrast to evaluate for dissection, which I have ordered. PT signed out pending imaging results.  Final Clinical Impression(s) / ED Diagnoses Final diagnoses:  None    Rx / DC Orders ED Discharge Orders    None       Pailyn Bellevue, Ambrose Finland, MD 07/21/20 2021

## 2020-07-21 NOTE — ED Notes (Signed)
Report attempted 

## 2020-07-21 NOTE — ED Notes (Signed)
Patient transported to MRI 

## 2020-07-21 NOTE — ED Triage Notes (Signed)
Pt bib ems from home with reports of sudden onset abd pain generalized and burning sensation. Pt also with bil flank pain and back pain. Denies NVD. Onset 0630 today. Given fentanyl en route.  200/100 HR 110 94% RA

## 2020-07-21 NOTE — H&P (Signed)
History and Physical  Monique Floyd YQM:[id number] DOB: 22-Jul-1980 DOA: 07/21/2020  Referring physician: Dr. Rhunette Croft, EDP PCP: Earleen Newport, MD  Outpatient Specialists: General surgery, gynecology. Patient coming from: Home.  Chief Complaint: Severe epigastric pain.  HPI: [name] is a 40 y.o. female with medical history significant for previous knees arthritis, no longer on NSAIDs for years, OSA with CPAP, morbid obesity status post bariatric surgery, laparoscopic pancreatic diversion, laparoscopic removal of lap band, laparoscopy cholecystectomy performed in 2019, laparoscopic repair and Cheree Ditto patch, diagnostic laparoscopy and washout of the abdomen, EGD performed in 2019 at [facility] who presented to [facility] ED due to sudden onset severe epigastric pain that woke her out of her sleep this morning.  Radiating across her upper abdominal quadrants and into her back.  No nausea, constipation or diarrhea.  No exacerbating or improving factors.  She was in her usual state of health prior to this.  No fever, chills or night sweats.  Pain persists in the ED.  CT abdomen and pelvis without contrast was nonacute.  MR angio chest and abdomen without contrast were also nonacute.  Work-up in the ED was unrevealing.  EDP consulted GI for possible EGD in the morning.  TRH, hospitalist team, was asked to admit.  ED Course:  Afebrile.  BP 119/71, pulse of 60, respiratory 28, with saturation 98% on room air.  Lab studies essentially unremarkable except for isolated elevated T bilirubin 1.3.  Troponin negative.  Lactic acid negative.  Lipase level normal.  UA positive for pyuria.  Review of Systems: Review of systems as noted in the HPI. All other systems reviewed and are negative.   Past Medical History:  Diagnosis Date  . Abnormal Pap smear   . Anxiety   . Asthma   . Prior pregnancy complicated by PIH, antepartum    Past Surgical History:  Procedure Laterality Date  . CESAREAN SECTION      Social  History:  reports that she quit smoking about 9 years ago. She does not have any smokeless tobacco history on file. No history on file for alcohol use and drug use.   Allergies  Allergen Reactions  . Bee Venom Shortness Of Breath and Swelling  . Codeine Nausea And Vomiting  . Contrast Media [Iodinated Diagnostic Agents] Anaphylaxis  . Hydromorphone Nausea And Vomiting    Family history: Father and mother with history of colon cancer.  Prior to Admission medications   Not on File    Physical Exam: BP 119/71   Pulse 60   Temp 97.6 F (36.4 C) (Oral)   Resp (!) 28   Ht 5\' 1"  (1.549 m)   Wt 65.8 kg   SpO2 98%   BMI 27.40 kg/m   . General: 40 y.o. year-old female well developed well nourished in no acute distress.  Alert and oriented x3.  Very uncomfortable due to severe abdominal pain. . Cardiovascular: Regular rate and rhythm with no rubs or gallops.  No thyromegaly or JVD noted.  No lower extremity edema. 2/4 pulses in all 4 extremities. 45 Respiratory: Clear to auscultation with no wheezes or rales. Good inspiratory effort. . Abdomen: Soft epigastric pain, right upper quadrant pain, nondistended with normal bowel sounds x4 quadrants. . Muskuloskeletal: No cyanosis, clubbing or edema noted bilaterally . Neuro: CN II-XII intact, strength, sensation, reflexes . Skin: No ulcerative lesions noted or rashes . Psychiatry: Judgement and insight appear normal. Mood is appropriate for condition and setting          Labs  on Admission:  Basic Metabolic Panel: Recent Labs  Lab 07/21/20 0927  NA 140  K 4.3  CL 109  CO2 22  GLUCOSE 89  BUN 8  CREATININE 0.64  CALCIUM 9.0   Liver Function Tests: Recent Labs  Lab 07/21/20 0927  AST 39  ALT 34  ALKPHOS 77  BILITOT 1.3*  PROT 6.7  ALBUMIN 4.2   Recent Labs  Lab 07/21/20 0927  LIPASE 32   No results for input(s): AMMONIA in the last 168 hours. CBC: Recent Labs  Lab 07/21/20 0927  WBC 7.7  NEUTROABS 4.5  HGB  15.6*  HCT 47.7*  MCV 99.4  PLT 263   Cardiac Enzymes: No results for input(s): CKTOTAL, CKMB, CKMBINDEX, TROPONINI in the last 168 hours.  BNP (last 3 results) No results for input(s): BNP in the last 8760 hours.  ProBNP (last 3 results) No results for input(s): PROBNP in the last 8760 hours.  CBG: No results for input(s): GLUCAP in the last 168 hours.  Radiological Exams on Admission: CT Abdomen Pelvis Wo Contrast  Result Date: 07/21/2020 CLINICAL DATA:  Abdominal pain. Patient reports a history of stones. History of a cholecystectomy. EXAM: CT ABDOMEN AND PELVIS WITHOUT CONTRAST TECHNIQUE: Multidetector CT imaging of the abdomen and pelvis was performed following the standard protocol without IV contrast. COMPARISON:  None. FINDINGS: Lower chest: Clear lung bases.  Heart normal in size. Hepatobiliary: Liver normal in overall size attenuation. There is a focal area of hypoattenuation, 3 cm in size, inferior aspect of segment 4 B, likely an area of focal fat. There is no mass effect from this. No other liver lesions. Status post cholecystectomy. No bile duct dilation. Pancreas: Choose 1 Spleen: Normal in size without focal abnormality. Adrenals/Urinary Tract: Adrenal glands are unremarkable. Kidneys are normal, without renal calculi, focal lesion, or hydronephrosis. Bladder is unremarkable. 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 No stomach wall thickening or inflammation. Small bowel and colon are normal in caliber. No wall thickening. No inflammatory changes. Normal appendix visualized. Vascular/Lymphatic: Mild aortic atherosclerosis. No enlarged lymph nodes. Reproductive: Uterus and bilateral adnexa are unremarkable. Other: No abdominal wall hernia or abnormality. No abdominopelvic ascites. Musculoskeletal: No fracture or acute finding.  No bone lesion. IMPRESSION: 1. No acute findings within the abdomen or pelvis. 2.  Changes consistent with previous gastric bypass surgery as well as a prior cholecystectomy. 3. 3 cm low-attenuation area in the inferior aspect the liver, segment 4 B, likely due to focal fat. This could reflect the sequela from previous gallbladder surgery. Consider further assessment with liver ultrasound or, preferably, liver MRI without and with contrast. 4. Mild aortic atherosclerosis. Electronically Signed   By: Amie Portlandavid  Ormond M.D.   On: 07/21/2020 12:01   MR ANGIO CHEST WO CONTRAST  Result Date: 07/21/2020 CLINICAL DATA:  Chest and abdominal pain EXAM: MRA ABDOMEN WITHOUT CONTRAST; MRA CHEST WITHOUT CONTRAST TECHNIQUE: Angiographic images of the chest and abdomen were obtained using MRA technique without intravenous contrast. CONTRAST:  None COMPARISON:  CT of the abdomen and pelvis from earlier in the same day. FINDINGS: MRA of the chest: Aorta: Examination is somewhat limited due to cardiac and pulsatile artifact. No aneurysmal dilatation of the thoracic aorta is noted. Segmental visualization of the aorta is seen without evidence of dissection. Heart: No cardiac enlargement is noted. Pulmonary Arteries:  Pulmonary arteries are grossly unremarkable. Other: Lungs are well aerated bilaterally. No bony abnormality is noted. Surrounding soft tissues  are within normal limits. No sizable hilar or mediastinal adenopathy is noted. MRA of the abdomen: Abdominal aorta is well visualized without aneurysmal dilatation or dissection. Celiac axis and superior mesenteric artery are widely patent hepatic venous and portal venous system are within normal limits. Limited evaluation of the liver, spleen, pancreas, adrenal glands and kidneys shows no acute abnormality. Left retroaortic renal vein is noted. No acute bony abnormality is noted. IMPRESSION: Somewhat limited exam due to motion artifact and lack of IV contrast. No findings to suggest the thoracic or abdominal aortic aneurysm or dissection are seen. No acute  abnormality is noted. Electronically Signed   By: Alcide Clever M.D.   On: 07/21/2020 18:55   MR ANGIO ABDOMEN WO CONTRAST  Result Date: 07/21/2020 CLINICAL DATA:  Chest and abdominal pain EXAM: MRA ABDOMEN WITHOUT CONTRAST; MRA CHEST WITHOUT CONTRAST TECHNIQUE: Angiographic images of the chest and abdomen were obtained using MRA technique without intravenous contrast. CONTRAST:  None COMPARISON:  CT of the abdomen and pelvis from earlier in the same day. FINDINGS: MRA of the chest: Aorta: Examination is somewhat limited due to cardiac and pulsatile artifact. No aneurysmal dilatation of the thoracic aorta is noted. Segmental visualization of the aorta is seen without evidence of dissection. Heart: No cardiac enlargement is noted. Pulmonary Arteries:  Pulmonary arteries are grossly unremarkable. Other: Lungs are well aerated bilaterally. No bony abnormality is noted. Surrounding soft tissues are within normal limits. No sizable hilar or mediastinal adenopathy is noted. MRA of the abdomen: Abdominal aorta is well visualized without aneurysmal dilatation or dissection. Celiac axis and superior mesenteric artery are widely patent hepatic venous and portal venous system are within normal limits. Limited evaluation of the liver, spleen, pancreas, adrenal glands and kidneys shows no acute abnormality. Left retroaortic renal vein is noted. No acute bony abnormality is noted. IMPRESSION: Somewhat limited exam due to motion artifact and lack of IV contrast. No findings to suggest the thoracic or abdominal aortic aneurysm or dissection are seen. No acute abnormality is noted. Electronically Signed   By: Alcide Clever M.D.   On: 07/21/2020 18:55    EKG: I independently viewed the EKG done and my findings are as followed: Sinus rhythm rate of 83.  Nonspecific ST-T changes.  QTc 448.  Assessment/Plan Present on Admission: . Abdominal pain  Active Problems:   Abdominal pain  Severe epigastric pain, unclear  etiology Presented with sudden onset severe epigastric pain radiated to her upper abdominal quadrants and into her back.  No associated nausea or vomiting or diarrhea. Work-up in the ED including CT abdomen and pelvis without contrast, MR angio chest, abdomen and pelvis were all nonacute Work-up in the ED essentially unrevealing. Lipase level normal Not anemic, hemoglobin 15.6. Troponin negative, 4. Lactic acid negative, 0.7. Lab work normal except for isolated elevated T bilirubin 1.3. UA positive for pyuria GI consulted by EDP for possible EGD in the morning. We will give a trial of Bentyl for bowel spasm IV Protonix 40 mg twice daily. N.p.o. except for sips and meds until seen by GI. IV Dilaudid as needed for severe pain, oxycodone as needed for moderate pain, Tylenol as needed for mild pain. Bowel regimen to avoid opioid-induced constipation.  Elevated T bilirubin Nonspecific Monitor and repeat CMP in the morning  Presumptive UTI, POA UA positive for pyuria Follow urine culture ordered in the ED. Keflex 500 mg 3 times daily x5 days Gentle IV fluid hydration LR 50 cc/h x 1 day. To note patient states  she cannot swallow whole pills, they have to be crushed due to previous esophageal surgery regarding her weight loss.  History of morbid obesity post bariatric surgery Current BMI 27 Will need to follow-up with her surgeons at [facility]   DVT prophylaxis: Subcu Lovenox daily  Code Status: Full code  Family Communication: None at bedside  Disposition Plan: Admit to MedSurg with remote telemetry.  Consults called: GI, Dr. Leone Payor, consulted by EDP.  Admission status: Observation status   Status is: Observation   Dispo:  Patient From: Home  Planned Disposition: Home, possibly on 07/22/2020 or when GI signs off.  Medically stable for discharge: No, ongoing management of symptomatology.       Darlin Drop MD [facility] Pager [phone]  If 7PM-7AM, please  contact night-coverage [email or web address] Password [facility]  07/21/2020, 7:46 PM

## 2020-07-21 NOTE — ED Notes (Signed)
Ice pack provided

## 2020-07-21 NOTE — ED Provider Notes (Signed)
  Physical Exam  BP (!) 113/52   Pulse 60   Temp 98.6 F (37 C) (Oral)   Resp (!) 26   Ht 5\' 1"  (1.549 m)   Wt 65.8 kg   SpO2 98%   BMI 27.40 kg/m   Physical Exam  ED Course/Procedures     Procedures  MDM   41 y/o F with severe abd pain. Hx of lap chole, lap band, ? DI leak s/p patch.  Severe abd pain, chest pain - radiation to back. CT non contrast ordered - neg.  MRI ordered to ensure there is no vascular cause.  If MRI is neg - and pt is severe pain, consult GI and admit.         43, MD 07/21/20 [phone]

## 2020-07-21 NOTE — ED Notes (Signed)
Pt returned from MRI °

## 2020-07-22 ENCOUNTER — Inpatient Hospital Stay (HOSPITAL_COMMUNITY): Payer: BC Managed Care – PPO

## 2020-07-22 DIAGNOSIS — K589 Irritable bowel syndrome without diarrhea: Secondary | ICD-10-CM | POA: Diagnosis not present

## 2020-07-22 DIAGNOSIS — Z9884 Bariatric surgery status: Secondary | ICD-10-CM

## 2020-07-22 DIAGNOSIS — R1013 Epigastric pain: Secondary | ICD-10-CM | POA: Diagnosis not present

## 2020-07-22 DIAGNOSIS — Z91030 Bee allergy status: Secondary | ICD-10-CM | POA: Diagnosis not present

## 2020-07-22 DIAGNOSIS — Z20822 Contact with and (suspected) exposure to covid-19: Secondary | ICD-10-CM | POA: Diagnosis present

## 2020-07-22 DIAGNOSIS — E876 Hypokalemia: Secondary | ICD-10-CM | POA: Diagnosis present

## 2020-07-22 DIAGNOSIS — R7989 Other specified abnormal findings of blood chemistry: Secondary | ICD-10-CM | POA: Diagnosis not present

## 2020-07-22 DIAGNOSIS — Z79899 Other long term (current) drug therapy: Secondary | ICD-10-CM | POA: Diagnosis not present

## 2020-07-22 DIAGNOSIS — M170 Bilateral primary osteoarthritis of knee: Secondary | ICD-10-CM | POA: Diagnosis present

## 2020-07-22 DIAGNOSIS — F419 Anxiety disorder, unspecified: Secondary | ICD-10-CM | POA: Diagnosis present

## 2020-07-22 DIAGNOSIS — N390 Urinary tract infection, site not specified: Secondary | ICD-10-CM | POA: Diagnosis present

## 2020-07-22 DIAGNOSIS — R7401 Elevation of levels of liver transaminase levels: Secondary | ICD-10-CM | POA: Diagnosis present

## 2020-07-22 DIAGNOSIS — Z885 Allergy status to narcotic agent status: Secondary | ICD-10-CM | POA: Diagnosis not present

## 2020-07-22 DIAGNOSIS — R109 Unspecified abdominal pain: Secondary | ICD-10-CM | POA: Diagnosis present

## 2020-07-22 DIAGNOSIS — G4733 Obstructive sleep apnea (adult) (pediatric): Secondary | ICD-10-CM | POA: Diagnosis present

## 2020-07-22 DIAGNOSIS — K921 Melena: Secondary | ICD-10-CM | POA: Diagnosis not present

## 2020-07-22 DIAGNOSIS — Z9049 Acquired absence of other specified parts of digestive tract: Secondary | ICD-10-CM | POA: Diagnosis not present

## 2020-07-22 DIAGNOSIS — Z91041 Radiographic dye allergy status: Secondary | ICD-10-CM | POA: Diagnosis not present

## 2020-07-22 DIAGNOSIS — Z800 Family history of malignant neoplasm of digestive organs: Secondary | ICD-10-CM | POA: Diagnosis not present

## 2020-07-22 DIAGNOSIS — Z87891 Personal history of nicotine dependence: Secondary | ICD-10-CM | POA: Diagnosis not present

## 2020-07-22 LAB — HIV ANTIBODY (ROUTINE TESTING W REFLEX): HIV Screen 4th Generation wRfx: NONREACTIVE

## 2020-07-22 LAB — COMPREHENSIVE METABOLIC PANEL
ALT: 173 U/L — ABNORMAL HIGH (ref 0–44)
AST: 301 U/L — ABNORMAL HIGH (ref 15–41)
Albumin: 3.2 g/dL — ABNORMAL LOW (ref 3.5–5.0)
Alkaline Phosphatase: 150 U/L — ABNORMAL HIGH (ref 38–126)
Anion gap: 8 (ref 5–15)
BUN: 11 mg/dL (ref 6–20)
CO2: 18 mmol/L — ABNORMAL LOW (ref 22–32)
Calcium: 8.5 mg/dL — ABNORMAL LOW (ref 8.9–10.3)
Chloride: 109 mmol/L (ref 98–111)
Creatinine, Ser: 0.64 mg/dL (ref 0.44–1.00)
GFR, Estimated: >60 mL/min (ref >60)
Glucose, Bld: 71 mg/dL (ref 70–99)
Potassium: 4.2 mmol/L (ref 3.5–5.1)
Sodium: 135 mmol/L (ref 135–145)
Total Bilirubin: 1.4 mg/dL — ABNORMAL HIGH (ref 0.3–1.2)
Total Protein: 5.3 g/dL — ABNORMAL LOW (ref 6.5–8.1)

## 2020-07-22 LAB — CBC
HCT: 38.4 % (ref 36.0–46.0)
Hemoglobin: 12.6 g/dL (ref 12.0–15.0)
MCH: 32.9 pg (ref 26.0–34.0)
MCHC: 32.8 g/dL (ref 30.0–36.0)
MCV: 100.3 fL — ABNORMAL HIGH (ref 80.0–100.0)
Platelets: 203 10*3/uL (ref 150–400)
RBC: 3.83 MIL/uL — ABNORMAL LOW (ref 3.87–5.11)
RDW: 13.2 % (ref 11.5–15.5)
WBC: 7.4 10*3/uL (ref 4.0–10.5)
nRBC: 0 % (ref 0.0–0.2)

## 2020-07-22 LAB — LIPASE, BLOOD: Lipase: 30 U/L (ref 11–51)

## 2020-07-22 LAB — URINE CULTURE

## 2020-07-22 LAB — PHOSPHORUS: Phosphorus: 5.1 mg/dL — ABNORMAL HIGH (ref 2.5–4.6)

## 2020-07-22 LAB — MAGNESIUM: Magnesium: 1.9 mg/dL (ref 1.7–2.4)

## 2020-07-22 MED ORDER — HYDROMORPHONE HCL 1 MG/ML IJ SOLN
2.0000 mg | INTRAMUSCULAR | Status: DC | PRN
  Administered 2020-07-22 – 2020-07-27 (×26): 2 mg via INTRAVENOUS
  Filled 2020-07-22 (×26): qty 2

## 2020-07-22 MED ORDER — ALUM & MAG HYDROXIDE-SIMETH 200-200-20 MG/5ML PO SUSP
30.0000 mL | Freq: Once | ORAL | Status: AC
  Administered 2020-07-22: 30 mL via ORAL
  Filled 2020-07-22: qty 30

## 2020-07-22 MED ORDER — HYDROMORPHONE HCL 1 MG/ML IJ SOLN
1.0000 mg | INTRAMUSCULAR | Status: DC | PRN
  Administered 2020-07-22 (×5): 1 mg via INTRAVENOUS
  Filled 2020-07-22 (×5): qty 1

## 2020-07-22 MED ORDER — LIDOCAINE VISCOUS HCL 2 % MT SOLN
15.0000 mL | Freq: Once | OROMUCOSAL | Status: AC
  Administered 2020-07-22: 15 mL via ORAL
  Filled 2020-07-22: qty 15

## 2020-07-22 MED ORDER — HYDROMORPHONE HCL 1 MG/ML IJ SOLN
2.0000 mg | Freq: Once | INTRAMUSCULAR | Status: AC
  Administered 2020-07-22: 2 mg via INTRAVENOUS
  Filled 2020-07-22: qty 2

## 2020-07-22 MED ORDER — HYDROMORPHONE HCL 1 MG/ML IJ SOLN
1.0000 mg | INTRAMUSCULAR | Status: DC | PRN
  Administered 2020-07-22: 1 mg via INTRAVENOUS

## 2020-07-22 MED ORDER — GADOBUTROL 1 MMOL/ML IV SOLN
6.5000 mL | Freq: Once | INTRAVENOUS | Status: AC | PRN
  Administered 2020-07-22: 6.5 mL via INTRAVENOUS

## 2020-07-22 NOTE — Progress Notes (Signed)
Patient arrived to [phone]. Report received from [place], [place]. Patient alert and oriented x4. Pt guarding abdomen, c/o 10/10 constant sharp abdominal pains. PRNS given, see MAR. Will continue to monitor.

## 2020-07-22 NOTE — Progress Notes (Signed)
PROGRESS NOTE  Monique Floyd TLX:[id number] DOB: 08/19/1980 DOA: 07/21/2020 PCP: Iva Boop, MD   LOS: 0 days   Brief narrative:  Monique Floyd is a 40 y.o. female with medical history significant for previous knees arthritis, OSA with CPAP, morbid obesity status post bariatric surgery, laparoscopic pancreatic diversion, laparoscopic removal of lap band, laparoscopy cholecystectomy performed in 2019, laparoscopic repair and Cheree Ditto patch, diagnostic laparoscopy and washout of the abdomen, EGD performed in 2019 at [facility] presented to hospital with sudden onset of severe epigastric pain radiating over the upper quadrants without any nausea constipation or diarrhea.  CT scan of the abdomen and pelvis without any acute findings.  MRI angiogram of the chest abdomen with negative as well.  Despite this she persisted to have abdominal pain and was admitted to hospital.  Lab work was normal as well.  GI was consulted and patient was admitted hospital for further evaluation and treatment.  Assessment/Plan:  Active Problems:   Abdominal pain  Severe epigastric pain, unclear etiology GI on board.  CT scan MRI negative.  GI has recommended MRCP at this time.  History of bariatric surgery laparoscopic pancreatic diabetes and removal of lap band and laparoscopic cholecystectomy in the past.  Continue IV fluids analgesics supportive care.  Follow GI recommendations.  Troponins negative lactate normal UA positive for pyuria.  Continue Bentyl IV Protonix IV Dilaudid for pain.    Mildly elevated bilirubin.   We will continue to monitor.  Presumptive UTI, POA Urinalysis positive for pyuria.  Continue Keflex.  Continue IV fluids   History of morbid obesity post bariatric surgery Current BMI 27. Patient follows up with surgeons at [facility]  DVT prophylaxis: enoxaparin (LOVENOX) injection 40 mg Start: 07/21/20 2200 SCDs Start: 07/21/20 1946   Code Status: Full code  Family Communication:   None  Status is: Inpatient  Remains inpatient appropriate because:IV treatments appropriate due to intensity of illness or inability to take PO and Inpatient level of care appropriate due to severity of illness   Dispo:  Patient From: Home  Planned Disposition: Home  Medically stable for discharge: No     Consultants:  GI  Procedures:  None so far  Anti-infectives: . Keflex  Anti-infectives (From admission, onward)   Start     Dose/Rate Route Frequency Ordered Stop   07/21/20 2030  cephALEXin (KEFLEX) capsule 500 mg        500 mg Oral Every 8 hours 07/21/20 1958 07/26/20 2159      Subjective: Today, patient was seen and examined at bedside.  Patient complains of excruciating epigastric pain,writhing in pain  Objective: Vitals:   07/21/20 2118 07/22/20 0459  BP: (!) 111/54 (!) 98/48  Pulse: (!) 52 (!) 57  Resp: 19 20  Temp: 98 F (36.7 C) 98.2 F (36.8 C)  SpO2: 99% 98%    Intake/Output Summary (Last 24 hours) at 07/22/2020 1328 Last data filed at 07/22/2020 0926 Gross per 24 hour  Intake 1337.27 ml  Output --  Net 1337.27 ml   Filed Weights   07/21/20 0857  Weight: 65.8 kg   Body mass index is 27.4 kg/m.   Physical Exam: GENERAL: Patient is alert awake and oriented. Not in obvious distress. HENT: No scleral pallor or icterus. Pupils equally reactive to light. Oral mucosa is dry NECK: is supple, no gross swelling noted. CHEST: Clear to auscultation. No crackles or wheezes.  Diminished breath sounds bilaterally. CVS: S1 and S2 heard, no murmur. Regular rate and rhythm.  ABDOMEN: Soft,  epigastric tenderness, bowel sounds are present. EXTREMITIES: No edema. CNS: Cranial nerves are intact. No focal motor deficits. SKIN: warm and dry without rashes.  Data Review: I have personally reviewed the following laboratory data and studies,  CBC: Recent Labs  Lab 07/21/20 0927 07/22/20 0157  WBC 7.7 7.4  NEUTROABS 4.5  --   HGB 15.6* 12.6  HCT 47.7*  38.4  MCV 99.4 100.3*  PLT 263 203   Basic Metabolic Panel: Recent Labs  Lab 07/21/20 0927 07/22/20 0157  NA 140 135  K 4.3 4.2  CL 109 109  CO2 22 18*  GLUCOSE 89 71  BUN 8 11  CREATININE 0.64 0.64  CALCIUM 9.0 8.5*  MG  --  1.9  PHOS  --  5.1*   Liver Function Tests: Recent Labs  Lab 07/21/20 0927 07/22/20 0157  AST 39 301*  ALT 34 173*  ALKPHOS 77 150*  BILITOT 1.3* 1.4*  PROT 6.7 5.3*  ALBUMIN 4.2 3.2*   Recent Labs  Lab 07/21/20 0927 07/22/20 0157  LIPASE 32 30   No results for input(s): AMMONIA in the last 168 hours. Cardiac Enzymes: No results for input(s): CKTOTAL, CKMB, CKMBINDEX, TROPONINI in the last 168 hours. BNP (last 3 results) No results for input(s): BNP in the last 8760 hours.  ProBNP (last 3 results) No results for input(s): PROBNP in the last 8760 hours.  CBG: No results for input(s): GLUCAP in the last 168 hours. Recent Results (from the past 240 hour(s))  Urine culture     Status: Abnormal   Collection Time: 07/21/20 10:50 AM   Specimen: Urine, Random  Result Value Ref Range Status   Specimen Description URINE, RANDOM  Final   Special Requests   Final    NONE Performed at [facility], [address], [place], [place] [address]    Culture MULTIPLE SPECIES PRESENT, SUGGEST RECOLLECTION (A)  Final   Report Status 07/22/2020 FINAL  Final  Resp Panel by RT-PCR (Flu A&B, Covid) Nasopharyngeal Swab     Status: None   Collection Time: 07/21/20  1:15 PM   Specimen: Nasopharyngeal Swab; Nasopharyngeal(NP) swabs in vial transport medium  Result Value Ref Range Status   SARS Coronavirus 2 by RT PCR NEGATIVE NEGATIVE Final    Comment: (NOTE) SARS-CoV-2 target nucleic acids are NOT DETECTED.  The SARS-CoV-2 RNA is generally detectable in upper respiratory specimens during the acute phase of infection. The lowest concentration of SARS-CoV-2 viral copies this assay can detect is 138 copies/mL. A negative result does not preclude  SARS-Cov-2 infection and should not be used as the sole basis for treatment or other patient management decisions. A negative result may occur with  improper specimen collection/handling, submission of specimen other than nasopharyngeal swab, presence of viral mutation(s) within the areas targeted by this assay, and inadequate number of viral copies(<138 copies/mL). A negative result must be combined with clinical observations, patient history, and epidemiological information. The expected result is Negative.  Fact Sheet for Patients:  BloggerCourse.com  Fact Sheet for Healthcare Providers:  SeriousBroker.it  This test is no t yet approved or cleared by the [place] FDA and  has been authorized for detection and/or diagnosis of SARS-CoV-2 by FDA under an Emergency Use Authorization (EUA). This EUA will remain  in effect (meaning this test can be used) for the duration of the COVID-19 declaration under Section 564(b)(1) of the Act, 21 U.S.C.section 360bbb-3(b)(1), unless the authorization is terminated  or revoked sooner.  Influenza A by PCR NEGATIVE NEGATIVE Final   Influenza B by PCR NEGATIVE NEGATIVE Final    Comment: (NOTE) The Xpert Xpress SARS-CoV-2/FLU/RSV plus assay is intended as an aid in the diagnosis of influenza from Nasopharyngeal swab specimens and should not be used as a sole basis for treatment. Nasal washings and aspirates are unacceptable for Xpert Xpress SARS-CoV-2/FLU/RSV testing.  Fact Sheet for Patients: BloggerCourse.com  Fact Sheet for Healthcare Providers: SeriousBroker.it  This test is not yet approved or cleared by the [place] FDA and has been authorized for detection and/or diagnosis of SARS-CoV-2 by FDA under an Emergency Use Authorization (EUA). This EUA will remain in effect (meaning this test can be used) for the duration of  the COVID-19 declaration under Section 564(b)(1) of the Act, 21 U.S.C. section 360bbb-3(b)(1), unless the authorization is terminated or revoked.  Performed at [facility], [address] [place], [place] [address]      Studies: CT Abdomen Pelvis Wo Contrast  Result Date: 07/21/2020 CLINICAL DATA:  Abdominal pain. Patient reports a history of stones. History of a cholecystectomy. EXAM: CT ABDOMEN AND PELVIS WITHOUT CONTRAST TECHNIQUE: Multidetector CT imaging of the abdomen and pelvis was performed following the standard protocol without IV contrast. COMPARISON:  None. FINDINGS: Lower chest: Clear lung bases.  Heart normal in size. Hepatobiliary: Liver normal in overall size attenuation. There is a focal area of hypoattenuation, 3 cm in size, inferior aspect of segment 4 B, likely an area of focal fat. There is no mass effect from this. No other liver lesions. Status post cholecystectomy. No bile duct dilation. Pancreas: Choose 1 Spleen: Normal in size without focal abnormality. Adrenals/Urinary Tract: Adrenal glands are unremarkable. Kidneys are normal, without renal calculi, focal lesion, or hydronephrosis. Bladder is unremarkable. 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 No stomach wall thickening or inflammation. Small bowel and colon are normal in caliber. No wall thickening. No inflammatory changes. Normal appendix visualized. Vascular/Lymphatic: Mild aortic atherosclerosis. No enlarged lymph nodes. Reproductive: Uterus and bilateral adnexa are unremarkable. Other: No abdominal wall hernia or abnormality. No abdominopelvic ascites. Musculoskeletal: No fracture or acute finding.  No bone lesion. IMPRESSION: 1. No acute findings within the abdomen or pelvis. 2. Changes consistent with previous gastric bypass surgery as well as a prior cholecystectomy. 3. 3 cm low-attenuation area in the inferior aspect the liver,  segment 4 B, likely due to focal fat. This could reflect the sequela from previous gallbladder surgery. Consider further assessment with liver ultrasound or, preferably, liver MRI without and with contrast. 4. Mild aortic atherosclerosis. Electronically Signed   By: Amie Portland M.D.   On: 07/21/2020 12:01   MR ANGIO CHEST WO CONTRAST  Result Date: 07/21/2020 CLINICAL DATA:  Chest and abdominal pain EXAM: MRA ABDOMEN WITHOUT CONTRAST; MRA CHEST WITHOUT CONTRAST TECHNIQUE: Angiographic images of the chest and abdomen were obtained using MRA technique without intravenous contrast. CONTRAST:  None COMPARISON:  CT of the abdomen and pelvis from earlier in the same day. FINDINGS: MRA of the chest: Aorta: Examination is somewhat limited due to cardiac and pulsatile artifact. No aneurysmal dilatation of the thoracic aorta is noted. Segmental visualization of the aorta is seen without evidence of dissection. Heart: No cardiac enlargement is noted. Pulmonary Arteries:  Pulmonary arteries are grossly unremarkable. Other: Lungs are well aerated bilaterally. No bony abnormality is noted. Surrounding soft tissues are within normal limits. No sizable hilar or mediastinal adenopathy is noted. MRA  of the abdomen: Abdominal aorta is well visualized without aneurysmal dilatation or dissection. Celiac axis and superior mesenteric artery are widely patent hepatic venous and portal venous system are within normal limits. Limited evaluation of the liver, spleen, pancreas, adrenal glands and kidneys shows no acute abnormality. Left retroaortic renal vein is noted. No acute bony abnormality is noted. IMPRESSION: Somewhat limited exam due to motion artifact and lack of IV contrast. No findings to suggest the thoracic or abdominal aortic aneurysm or dissection are seen. No acute abnormality is noted. Electronically Signed   By: Alcide CleverMark  Lukens M.D.   On: 07/21/2020 18:55   MR ANGIO ABDOMEN WO CONTRAST  Result Date: 07/21/2020 CLINICAL  DATA:  Chest and abdominal pain EXAM: MRA ABDOMEN WITHOUT CONTRAST; MRA CHEST WITHOUT CONTRAST TECHNIQUE: Angiographic images of the chest and abdomen were obtained using MRA technique without intravenous contrast. CONTRAST:  None COMPARISON:  CT of the abdomen and pelvis from earlier in the same day. FINDINGS: MRA of the chest: Aorta: Examination is somewhat limited due to cardiac and pulsatile artifact. No aneurysmal dilatation of the thoracic aorta is noted. Segmental visualization of the aorta is seen without evidence of dissection. Heart: No cardiac enlargement is noted. Pulmonary Arteries:  Pulmonary arteries are grossly unremarkable. Other: Lungs are well aerated bilaterally. No bony abnormality is noted. Surrounding soft tissues are within normal limits. No sizable hilar or mediastinal adenopathy is noted. MRA of the abdomen: Abdominal aorta is well visualized without aneurysmal dilatation or dissection. Celiac axis and superior mesenteric artery are widely patent hepatic venous and portal venous system are within normal limits. Limited evaluation of the liver, spleen, pancreas, adrenal glands and kidneys shows no acute abnormality. Left retroaortic renal vein is noted. No acute bony abnormality is noted. IMPRESSION: Somewhat limited exam due to motion artifact and lack of IV contrast. No findings to suggest the thoracic or abdominal aortic aneurysm or dissection are seen. No acute abnormality is noted. Electronically Signed   By: Alcide CleverMark  Lukens M.D.   On: 07/21/2020 18:55      Joycelyn DasLaxman Jule Whitsel, MD  [facility] 07/22/2020  If 7PM-7AM, please contact night-coverage

## 2020-07-22 NOTE — Plan of Care (Signed)

## 2020-07-22 NOTE — Consult Note (Addendum)
Referring Provider:  [facility]         Primary Care Physician:  Dr. Bethann Goo Primary Gastroenterologist:    Althia Forts         We were asked to see this patient for:   Epigastric pain             ASSESSMENT / PLAN:   # 40 yo female with history of a biliopancreatic diversion with duodenal switch , repair of duodenoileostomy leak and cholecystectomy Sept 2019 at [facility].   # Severe upper abdominal pain radiating through to back. New elevation in liver chemistries overnight. No acute findings on non-contrast CT scan or MRI yesterday.  --Presumably her pain and new elevation in liver chemistries are related. Will obtain MRCP to get better look at bile ducts.   # [facility] of colon cancer in both parents ( mother passed at age 24). Patient's last colonoscopy was ~3 years ago in [place]. She is suppose to have a 5 year interval colonoscopy     Attending Physician Note   I have taken a history, examined the patient and reviewed the chart. I agree with the Advanced Practitioner's note, impression and recommendations.  Severe, acute epigastric pain radiating to the back with acute LFT elevation. Prior cholecystectomy. Suspect choledocholithiasis. Proceed MRCP today. Primary service to address pain control. Given biliopancreatic diversion with duodenal switch referral to a higher level of care will be required if ERCP is needed.   Monique Edward, MD [facility] [phone]      HPI:                                                                                                                             Chief Complaint: abdominal pain   Monique Floyd is a 40 y.o. female with a past medical history of morbid obesity, biliopancreatic diversion with duodenal switch, cholecystectomy, OSA, asthma  Monique Floyd has a history of a failed lap band. She underwent removal of pain with revision to biliopancreatic diversion with duodenal switch and repair of duodenoileostomy leak Sept 2019 at [facility].    Patient brought by EMS to ED yesterday for sudden onset of abdominal pain and back pain. She describes acute onset of severe epigastric pain radiating toward RUQ and straight through to her back. Pain is constant, burning like. The pain isunrelated to eating since she hasn't eaten since onset of pain. No significant nausea / vomiting except for during the night but she thinks that was related to pain medication. She doesn't take NSAIDs. No history of PUD. No black stools. Last weeks she had minor rectal bleeding with BM x 1 episodes. BMs otherwise normal.   She was hypertensive and tachycardic.  Tbili 1.3, remained of liver chemistries normal. Normal lipase. No acute findings on CT scan. EKG reassuring. MRA angio of chest and abdomen without acute findings. UA suspicious but culture suggests contamination  Today's labs show rise in previously normal liver chemistries to Alk phos  o150, AST 301 / ALT 173, bili 1.4.     PREVIOUS ENDOSCOPIC EVALUATIONS / PERTINENT STUDIES   Gets colonoscopies in [facility] of colon cancer   Past Medical History:  Diagnosis Date  . Abnormal Pap smear   . Anxiety   . Asthma   . Prior pregnancy complicated by PIH, antepartum     Past Surgical History:  Procedure Laterality Date  . CESAREAN SECTION      Prior to Admission medications   Not on File    Current Facility-Administered Medications  Medication Dose Route Frequency Provider Last Rate Last Admin  . acetaminophen (TYLENOL) tablet 650 mg  650 mg Oral Q6H PRN Irene Pap N, DO      . cephALEXin (KEFLEX) capsule 500 mg  500 mg Oral Q8H Hall, Carole N, DO   500 mg at 07/22/20 0601  . dicyclomine (BENTYL) capsule 10 mg  10 mg Oral TID AC & HS Hall, Carole N, DO   10 mg at 07/22/20 0731  . enoxaparin (LOVENOX) injection 40 mg  40 mg Subcutaneous Q24H Irene Pap N, DO   40 mg at 07/21/20 2142  . HYDROmorphone (DILAUDID) injection 1 mg  1 mg Intravenous Q3H PRN Irene Pap N, DO   1 mg at  07/22/20 0732  . lactated ringers infusion   Intravenous Continuous Kayleen Memos, DO 50 mL/hr at 07/21/20 2126 New Bag at 07/21/20 2126  . melatonin tablet 3 mg  3 mg Oral QHS PRN Irene Pap N, DO   3 mg at 07/21/20 2143  . ondansetron (ZOFRAN) injection 4 mg  4 mg Intravenous Q6H PRN Irene Pap N, DO   4 mg at 07/21/20 2234  . oxyCODONE (Oxy IR/ROXICODONE) immediate release tablet 5 mg  5 mg Oral Q3H PRN Irene Pap N, DO   5 mg at 07/22/20 0843  . pantoprazole (PROTONIX) injection 40 mg  40 mg Intravenous BID Irene Pap N, DO   40 mg at 07/22/20 0845  . senna-docusate (Senokot-S) tablet 2 tablet  2 tablet Oral QHS Irene Pap N, DO   2 tablet at 07/21/20 2127    Allergies as of 07/21/2020 - Review Complete 07/21/2020  Allergen Reaction Noted  . Bee venom Shortness Of Breath and Swelling 11/21/2017  . Codeine Nausea And Vomiting 11/08/2011  . Contrast media [iodinated diagnostic agents] Anaphylaxis 07/21/2020  . Hydromorphone Nausea And Vomiting 11/28/2017    No family history on file.  Social History   Socioeconomic History  . Marital status: Married    Spouse name: Not on file  . Number of children: Not on file  . Years of education: Not on file  . Highest education level: Not on file  Occupational History  . Not on file  Tobacco Use  . Smoking status: Former Smoker    Quit date: 06/14/2011    Years since quitting: 9.1  . Smokeless tobacco: Not on file  Substance and Sexual Activity  . Alcohol use: Not on file  . Drug use: Not on file  . Sexual activity: Not on file  Other Topics Concern  . Not on file  Social History Narrative  . Not on file   Social Determinants of Health   Financial Resource Strain: Not on file  Food Insecurity: Not on file  Transportation Needs: Not on file  Physical Activity: Not on file  Stress: Not on file  Social Connections: Not on file  Intimate Partner Violence: Not on file    Review  of Systems: All systems reviewed and  negative except where noted in HPI.    OBJECTIVE:    Physical Exam: Vital signs in last 24 hours: Temp:  [97.4 F (36.3 C)-98.2 F (36.8 C)] 98.2 F (36.8 C) (05/10 0459) Pulse Rate:  [48-115] 57 (05/10 0459) Resp:  [12-36] 20 (05/10 0459) BP: (86-136)/(48-84) 98/48 (05/10 0459) SpO2:  [92 %-99 %] 98 % (05/10 0459) Last BM Date:  (PTA) General:   Alert  female in NAD having significant upper abdominal pain  Psych:  Pleasant, cooperative. Normal mood and affect. Eyes:  Pupils equal, sclera clear, no icterus.   Conjunctiva pink. Ears:  Normal auditory acuity. Nose:  No deformity, discharge,  or lesions. Neck:  Supple; no masses Lungs:  Bilateral Rhonchi.  Heart:  Regular rate and rhythm; no murmurs Abdomen: Soft, ND. Significant upper tenderness even to light palpation. Active BS Msk:  Symmetrical without gross deformities. . Neurologic:  Alert and  oriented x4;  grossly normal neurologically. Skin:  Intact without significant lesions or rashes.  Filed Weights   07/21/20 0857  Weight: 65.8 kg     Scheduled inpatient medications . cephALEXin  500 mg Oral Q8H  . dicyclomine  10 mg Oral TID AC & HS  . enoxaparin (LOVENOX) injection  40 mg Subcutaneous Q24H  . pantoprazole (PROTONIX) IV  40 mg Intravenous BID  . senna-docusate  2 tablet Oral QHS      Intake/Output from previous day: 05/09 0701 - 05/10 0700 In: 2236.4 [I.V.:337.3; IV Piggyback:1899.1] Out: -  Intake/Output this shift: No intake/output data recorded.   Lab Results: Recent Labs    07/21/20 0927 07/22/20 0157  WBC 7.7 7.4  HGB 15.6* 12.6  HCT 47.7* 38.4  PLT 263 203   BMET Recent Labs    07/21/20 0927 07/22/20 0157  NA 140 135  K 4.3 4.2  CL 109 109  CO2 22 18*  GLUCOSE 89 71  BUN 8 11  CREATININE 0.64 0.64  CALCIUM 9.0 8.5*   LFT Recent Labs    07/22/20 0157  PROT 5.3*  ALBUMIN 3.2*  AST 301*  ALT 173*  ALKPHOS 150*  BILITOT 1.4*   PT/INR No results for input(s):  LABPROT, INR in the last 72 hours. Hepatitis Panel No results for input(s): HEPBSAG, HCVAB, HEPAIGM, HEPBIGM in the last 72 hours.   . CBC Latest Ref Rng & Units 07/22/2020 07/21/2020  WBC 4.0 - 10.5 K/uL 7.4 7.7  Hemoglobin 12.0 - 15.0 g/dL 12.6 15.6(H)  Hematocrit 36.0 - 46.0 % 38.4 47.7(H)  Platelets 150 - 400 K/uL 203 263    . CMP Latest Ref Rng & Units 07/22/2020 07/21/2020  Glucose 70 - 99 mg/dL 71 89  BUN 6 - 20 mg/dL 11 8  Creatinine 0.44 - 1.00 mg/dL 0.64 0.64  Sodium 135 - 145 mmol/L 135 140  Potassium 3.5 - 5.1 mmol/L 4.2 4.3  Chloride 98 - 111 mmol/L 109 109  CO2 22 - 32 mmol/L 18(L) 22  Calcium 8.9 - 10.3 mg/dL 8.5(L) 9.0  Total Protein 6.5 - 8.1 g/dL 5.3(L) 6.7  Total Bilirubin 0.3 - 1.2 mg/dL 1.4(H) 1.3(H)  Alkaline Phos 38 - 126 U/L 150(H) 77  AST 15 - 41 U/L 301(H) 39  ALT 0 - 44 U/L 173(H) 34   Studies/Results: CT Abdomen Pelvis Wo Contrast  Result Date: 07/21/2020 CLINICAL DATA:  Abdominal pain. Patient reports a history of stones. History of a cholecystectomy. EXAM: CT ABDOMEN AND PELVIS WITHOUT CONTRAST TECHNIQUE: Multidetector  CT imaging of the abdomen and pelvis was performed following the standard protocol without IV contrast. COMPARISON:  None. FINDINGS: Lower chest: Clear lung bases.  Heart normal in size. Hepatobiliary: Liver normal in overall size attenuation. There is a focal area of hypoattenuation, 3 cm in size, inferior aspect of segment 4 B, likely an area of focal fat. There is no mass effect from this. No other liver lesions. Status post cholecystectomy. No bile duct dilation. Pancreas: Choose 1 Spleen: Normal in size without focal abnormality. Adrenals/Urinary Tract: Adrenal glands are unremarkable. Kidneys are normal, without renal calculi, focal lesion, or hydronephrosis. Bladder is unremarkable. 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 No stomach wall thickening or inflammation.  Small bowel and colon are normal in caliber. No wall thickening. No inflammatory changes. Normal appendix visualized. Vascular/Lymphatic: Mild aortic atherosclerosis. No enlarged lymph nodes. Reproductive: Uterus and bilateral adnexa are unremarkable. Other: No abdominal wall hernia or abnormality. No abdominopelvic ascites. Musculoskeletal: No fracture or acute finding.  No bone lesion. IMPRESSION: 1. No acute findings within the abdomen or pelvis. 2. Changes consistent with previous gastric bypass surgery as well as a prior cholecystectomy. 3. 3 cm low-attenuation area in the inferior aspect the liver, segment 4 B, likely due to focal fat. This could reflect the sequela from previous gallbladder surgery. Consider further assessment with liver ultrasound or, preferably, liver MRI without and with contrast. 4. Mild aortic atherosclerosis. Electronically Signed   By: Lajean Manes M.D.   On: 07/21/2020 12:01   MR ANGIO CHEST WO CONTRAST  Result Date: 07/21/2020 CLINICAL DATA:  Chest and abdominal pain EXAM: MRA ABDOMEN WITHOUT CONTRAST; MRA CHEST WITHOUT CONTRAST TECHNIQUE: Angiographic images of the chest and abdomen were obtained using MRA technique without intravenous contrast. CONTRAST:  None COMPARISON:  CT of the abdomen and pelvis from earlier in the same day. FINDINGS: MRA of the chest: Aorta: Examination is somewhat limited due to cardiac and pulsatile artifact. No aneurysmal dilatation of the thoracic aorta is noted. Segmental visualization of the aorta is seen without evidence of dissection. Heart: No cardiac enlargement is noted. Pulmonary Arteries:  Pulmonary arteries are grossly unremarkable. Other: Lungs are well aerated bilaterally. No bony abnormality is noted. Surrounding soft tissues are within normal limits. No sizable hilar or mediastinal adenopathy is noted. MRA of the abdomen: Abdominal aorta is well visualized without aneurysmal dilatation or dissection. Celiac axis and superior mesenteric  artery are widely patent hepatic venous and portal venous system are within normal limits. Limited evaluation of the liver, spleen, pancreas, adrenal glands and kidneys shows no acute abnormality. Left retroaortic renal vein is noted. No acute bony abnormality is noted. IMPRESSION: Somewhat limited exam due to motion artifact and lack of IV contrast. No findings to suggest the thoracic or abdominal aortic aneurysm or dissection are seen. No acute abnormality is noted. Electronically Signed   By: Inez Catalina M.D.   On: 07/21/2020 18:55   MR ANGIO ABDOMEN WO CONTRAST  Result Date: 07/21/2020 CLINICAL DATA:  Chest and abdominal pain EXAM: MRA ABDOMEN WITHOUT CONTRAST; MRA CHEST WITHOUT CONTRAST TECHNIQUE: Angiographic images of the chest and abdomen were obtained using MRA technique without intravenous contrast. CONTRAST:  None COMPARISON:  CT of the abdomen and pelvis from earlier in the same day. FINDINGS: MRA of the chest: Aorta: Examination is somewhat limited due to cardiac and pulsatile artifact. No aneurysmal dilatation of the thoracic aorta is noted. Segmental visualization of the aorta is seen  without evidence of dissection. Heart: No cardiac enlargement is noted. Pulmonary Arteries:  Pulmonary arteries are grossly unremarkable. Other: Lungs are well aerated bilaterally. No bony abnormality is noted. Surrounding soft tissues are within normal limits. No sizable hilar or mediastinal adenopathy is noted. MRA of the abdomen: Abdominal aorta is well visualized without aneurysmal dilatation or dissection. Celiac axis and superior mesenteric artery are widely patent hepatic venous and portal venous system are within normal limits. Limited evaluation of the liver, spleen, pancreas, adrenal glands and kidneys shows no acute abnormality. Left retroaortic renal vein is noted. No acute bony abnormality is noted. IMPRESSION: Somewhat limited exam due to motion artifact and lack of IV contrast. No findings to suggest  the thoracic or abdominal aortic aneurysm or dissection are seen. No acute abnormality is noted. Electronically Signed   By: Inez Catalina M.D.   On: 07/21/2020 18:55    Active Problems:   Abdominal pain    Tye Savoy, NP-C @  07/22/2020, 9:24 AM

## 2020-07-22 NOTE — Progress Notes (Signed)
Pt c/o constant, sharp upper epigastric pains unrelieved by PRNs given. Pt stated that the meds are not touching her pain and the pain is becoming unbearable. Pt also had an unmeasured emesis occurrence. PRN given.  MD paged and made aware of of pt condition. New orders given, see MAR. Will continue to monitor

## 2020-07-23 DIAGNOSIS — R1013 Epigastric pain: Secondary | ICD-10-CM | POA: Diagnosis not present

## 2020-07-23 DIAGNOSIS — R7989 Other specified abnormal findings of blood chemistry: Secondary | ICD-10-CM | POA: Diagnosis not present

## 2020-07-23 LAB — COMPREHENSIVE METABOLIC PANEL
ALT: 201 U/L — ABNORMAL HIGH (ref 0–44)
AST: 227 U/L — ABNORMAL HIGH (ref 15–41)
Albumin: 3.2 g/dL — ABNORMAL LOW (ref 3.5–5.0)
Alkaline Phosphatase: 192 U/L — ABNORMAL HIGH (ref 38–126)
Anion gap: 11 (ref 5–15)
BUN: 12 mg/dL (ref 6–20)
CO2: 21 mmol/L — ABNORMAL LOW (ref 22–32)
Calcium: 8.6 mg/dL — ABNORMAL LOW (ref 8.9–10.3)
Chloride: 104 mmol/L (ref 98–111)
Creatinine, Ser: 0.73 mg/dL (ref 0.44–1.00)
GFR, Estimated: >60 mL/min (ref >60)
Glucose, Bld: 60 mg/dL — ABNORMAL LOW (ref 70–99)
Potassium: 4 mmol/L (ref 3.5–5.1)
Sodium: 136 mmol/L (ref 135–145)
Total Bilirubin: 0.9 mg/dL (ref 0.3–1.2)
Total Protein: 5.3 g/dL — ABNORMAL LOW (ref 6.5–8.1)

## 2020-07-23 LAB — HEPATITIS PANEL, ACUTE
HCV Ab: NONREACTIVE
Hep A IgM: NONREACTIVE
Hep B C IgM: NONREACTIVE
Hepatitis B Surface Ag: NONREACTIVE

## 2020-07-23 LAB — CBC
HCT: 37.3 % (ref 36.0–46.0)
Hemoglobin: 12.1 g/dL (ref 12.0–15.0)
MCH: 32.4 pg (ref 26.0–34.0)
MCHC: 32.4 g/dL (ref 30.0–36.0)
MCV: 99.7 fL (ref 80.0–100.0)
Platelets: 186 10*3/uL (ref 150–400)
RBC: 3.74 MIL/uL — ABNORMAL LOW (ref 3.87–5.11)
RDW: 13 % (ref 11.5–15.5)
WBC: 6.1 10*3/uL (ref 4.0–10.5)
nRBC: 0 % (ref 0.0–0.2)

## 2020-07-23 MED ORDER — HYDROXYZINE HCL 25 MG PO TABS
25.0000 mg | ORAL_TABLET | Freq: Once | ORAL | Status: AC | PRN
  Administered 2020-07-23: 25 mg via ORAL
  Filled 2020-07-23: qty 1

## 2020-07-23 MED ORDER — HYDROXYZINE HCL 25 MG PO TABS
50.0000 mg | ORAL_TABLET | Freq: Once | ORAL | Status: AC
  Administered 2020-07-24: 50 mg via ORAL
  Filled 2020-07-23: qty 2

## 2020-07-23 MED ORDER — LACTATED RINGERS IV SOLN: INTRAVENOUS | Status: DC

## 2020-07-23 NOTE — Progress Notes (Signed)
Pt had a BM and stated that bright red blood was in her stool. Pt flushed before I could see it. Pt stated that her pain became worse after BM. MD paged and made aware. Will continue to monitor.

## 2020-07-23 NOTE — Progress Notes (Addendum)
Daily Rounding Note  07/23/2020, 10:57 AM  LOS: 1 day   SUBJECTIVE:   Chief complaint:   Abdominal pain, elevated LFTs. Abdominal pain continues.  Nonbloody emesis overnight.  Feels miserable.  OBJECTIVE:         Vital signs in last 24 hours:    Temp:  [98.1 F (36.7 C)-98.5 F (36.9 C)] 98.5 F (36.9 C) (05/10 2029) Pulse Rate:  [51-58] 58 (05/10 2029) Resp:  [19] 19 (05/10 2029) BP: (101-126)/(42-61) 101/61 (05/10 2029) SpO2:  [89 %-100 %] 89 % (05/10 2029) Last BM Date:  (PTA) Filed Weights   07/21/20 0857  Weight: 65.8 kg   General: Looks uncomfortable.  Mildly ill. Heart: RRR. Chest: Clear bilaterally without labored breathing or cough Abdomen: Diffusely tender without guarding or rebound, pt says more tender on the right upper quadrant.  Active bowel sound Extremities: No CCE Neuro/Psych: Distressed, seems on the verge of tears.  Cooperative.  Intake/Output from previous day: No intake/output data recorded.  Intake/Output this shift: No intake/output data recorded.  Lab Results: Recent Labs    07/21/20 0927 07/22/20 0157 07/23/20 0333  WBC 7.7 7.4 6.1  HGB 15.6* 12.6 12.1  HCT 47.7* 38.4 37.3  PLT 263 203 186   BMET Recent Labs    07/21/20 0927 07/22/20 0157 07/23/20 0333  NA 140 135 136  K 4.3 4.2 4.0  CL 109 109 104  CO2 22 18* 21*  GLUCOSE 89 71 60*  BUN $Re'8 11 12  'rkv$ CREATININE 0.64 0.64 0.73  CALCIUM 9.0 8.5* 8.6*   LFT Recent Labs    07/21/20 0927 07/22/20 0157 07/23/20 0333  PROT 6.7 5.3* 5.3*  ALBUMIN 4.2 3.2* 3.2*  AST 39 301* 227*  ALT 34 173* 201*  ALKPHOS 77 150* 192*  BILITOT 1.3* 1.4* 0.9    Studies/Results: CT Abdomen Pelvis Wo Contrast  Result Date: 07/21/2020 CLINICAL DATA:  Abdominal pain. Patient reports a history of stones. History of a cholecystectomy. EXAM: CT ABDOMEN AND PELVIS WITHOUT CONTRAST TECHNIQUE: Multidetector CT imaging of the abdomen and  pelvis was performed following the standard protocol without IV contrast. COMPARISON:  None. FINDINGS: Lower chest: Clear lung bases.  Heart normal in size. Hepatobiliary: Liver normal in overall size attenuation. There is a focal area of hypoattenuation, 3 cm in size, inferior aspect of segment 4 B, likely an area of focal fat. There is no mass effect from this. No other liver lesions. Status post cholecystectomy. No bile duct dilation. Pancreas: Choose 1 Spleen: Normal in size without focal abnormality. Adrenals/Urinary Tract: Adrenal glands are unremarkable. Kidneys are normal, without renal calculi, focal lesion, or hydronephrosis. Bladder is unremarkable. 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 No stomach wall thickening or inflammation. Small bowel and colon are normal in caliber. No wall thickening. No inflammatory changes. Normal appendix visualized. Vascular/Lymphatic: Mild aortic atherosclerosis. No enlarged lymph nodes. Reproductive: Uterus and bilateral adnexa are unremarkable. Other: No abdominal wall hernia or abnormality. No abdominopelvic ascites. Musculoskeletal: No fracture or acute finding.  No bone lesion. IMPRESSION: 1. No acute findings within the abdomen or pelvis. 2. Changes consistent with previous gastric bypass surgery as well as a prior cholecystectomy. 3. 3 cm low-attenuation area in the inferior aspect the liver, segment 4 B, likely due to focal fat. This could reflect the sequela from previous gallbladder surgery. Consider further assessment with liver ultrasound or, preferably, liver MRI without and  with contrast. 4. Mild aortic atherosclerosis. Electronically Signed   By: Lajean Manes M.D.   On: 07/21/2020 12:01   MR ANGIO CHEST WO CONTRAST  Result Date: 07/21/2020 CLINICAL DATA:  Chest and abdominal pain EXAM: MRA ABDOMEN WITHOUT CONTRAST; MRA CHEST WITHOUT CONTRAST TECHNIQUE: Angiographic images of the  chest and abdomen were obtained using MRA technique without intravenous contrast. CONTRAST:  None COMPARISON:  CT of the abdomen and pelvis from earlier in the same day. FINDINGS: MRA of the chest: Aorta: Examination is somewhat limited due to cardiac and pulsatile artifact. No aneurysmal dilatation of the thoracic aorta is noted. Segmental visualization of the aorta is seen without evidence of dissection. Heart: No cardiac enlargement is noted. Pulmonary Arteries:  Pulmonary arteries are grossly unremarkable. Other: Lungs are well aerated bilaterally. No bony abnormality is noted. Surrounding soft tissues are within normal limits. No sizable hilar or mediastinal adenopathy is noted. MRA of the abdomen: Abdominal aorta is well visualized without aneurysmal dilatation or dissection. Celiac axis and superior mesenteric artery are widely patent hepatic venous and portal venous system are within normal limits. Limited evaluation of the liver, spleen, pancreas, adrenal glands and kidneys shows no acute abnormality. Left retroaortic renal vein is noted. No acute bony abnormality is noted. IMPRESSION: Somewhat limited exam due to motion artifact and lack of IV contrast. No findings to suggest the thoracic or abdominal aortic aneurysm or dissection are seen. No acute abnormality is noted. Electronically Signed   By: Inez Catalina M.D.   On: 07/21/2020 18:55   MR ANGIO ABDOMEN WO CONTRAST  Result Date: 07/21/2020 CLINICAL DATA:  Chest and abdominal pain EXAM: MRA ABDOMEN WITHOUT CONTRAST; MRA CHEST WITHOUT CONTRAST TECHNIQUE: Angiographic images of the chest and abdomen were obtained using MRA technique without intravenous contrast. CONTRAST:  None COMPARISON:  CT of the abdomen and pelvis from earlier in the same day. FINDINGS: MRA of the chest: Aorta: Examination is somewhat limited due to cardiac and pulsatile artifact. No aneurysmal dilatation of the thoracic aorta is noted. Segmental visualization of the aorta is seen  without evidence of dissection. Heart: No cardiac enlargement is noted. Pulmonary Arteries:  Pulmonary arteries are grossly unremarkable. Other: Lungs are well aerated bilaterally. No bony abnormality is noted. Surrounding soft tissues are within normal limits. No sizable hilar or mediastinal adenopathy is noted. MRA of the abdomen: Abdominal aorta is well visualized without aneurysmal dilatation or dissection. Celiac axis and superior mesenteric artery are widely patent hepatic venous and portal venous system are within normal limits. Limited evaluation of the liver, spleen, pancreas, adrenal glands and kidneys shows no acute abnormality. Left retroaortic renal vein is noted. No acute bony abnormality is noted. IMPRESSION: Somewhat limited exam due to motion artifact and lack of IV contrast. No findings to suggest the thoracic or abdominal aortic aneurysm or dissection are seen. No acute abnormality is noted. Electronically Signed   By: Inez Catalina M.D.   On: 07/21/2020 18:55   MR ABDOMEN MRCP W WO CONTAST  Result Date: 07/22/2020 CLINICAL DATA:  Abdominal pain and elevated liver function test. Post bariatric surgery. Prior removal of lap band. Prior cholecystectomy. EXAM: MRI ABDOMEN WITHOUT AND WITH CONTRAST (INCLUDING MRCP) TECHNIQUE: Multiplanar multisequence MR imaging of the abdomen was performed both before and after the administration of intravenous contrast. Heavily T2-weighted images of the biliary and pancreatic ducts were obtained, and three-dimensional MRCP images were rendered by post processing. CONTRAST:  6.59mL GADAVIST GADOBUTROL 1 MMOL/ML IV SOLN COMPARISON:  CT 07/21/2020 FINDINGS:  Lower chest:  Lung bases are clear. Hepatobiliary: Postcholecystectomy anatomy. No significant intrahepatic duct dilatation. The common hepatic duct measures 7 mm. The common bile duct measures 5 mm (image 17/series 4). No filling defect within the common bile duct MRCP sequences are degraded by patient motion.  Opposed phase imaging demonstrates no significant hepatic steatosis (series 11). There is however loss of signal intensity along the inferior margin of the liver on out of phase imaging consistent with focal fatty infiltration (image 22/series 11). Postcontrast enhanced imaging demonstrates no enhancing hepatic lesion. Pancreas: Normal pancreatic duct. No variant pancreatic ductal anatomy. No pancreatic inflammation or lesion. Spleen: Normal spleen. Adrenals/urinary tract: Adrenal glands and kidneys are normal. Stomach/Bowel: Post bariatric surgery with duodenal biliary diversion. Natalie difficult to follow on MRI due to bowel movement. Vascular/Lymphatic: Abdominal aortic normal caliber. No retroperitoneal periportal lymphadenopathy. Musculoskeletal: No aggressive osseous lesion IMPRESSION: 1. Normal biliary tree post cholecystectomy. No choledocholithiasis. 2. No pancreatic inflammation.  Normal pancreatic ductal anatomy. 3. Focal fatty infiltration along the inferior margin the RIGHT hepatic lobe. 4. Bariatric surgery anatomy difficult define on MRI. Electronically Signed   By: Suzy Bouchard M.D.   On: 07/22/2020 18:29    ASSESMENT:   *   Abdominal pain, elevated LFTs. No source for symptoms on noncontrast CT which showed focal hypoattenuation in segment 4B of the liver, likely focal fat.  Changes consistent with previous gastric bypass and prior cholecystectomy. MRI/MRCP: Biliary tree postcholecystectomy normal.  No choledocholithiasis.  No pancreatic inflammation, PD normal.  Focal fatty infiltration in inferior margin right hepatic lobe. LFTs remain elevated with rising alk phos, rising ALT, declining AST, normalized mild elevation T bili.  Lipase normal  *   S/p bariatric biliopancreatic diversion, duodenal switch, repair duodenal ileostomy leak, cholecystectomy 11/2017 in [place].  *   Family history colon cancer in both parents.  Patient's last colonoscopy around 3 years ago.   PLAN    *   Per Dr Fuller Plan.     Monique Floyd  07/23/2020, 10:57 AM Phone [phone]    Attending Physician Note   I have taken an interval history, reviewed the chart and examined the patient. I agree with the Advanced Practitioner's note, impression and recommendations.   Her presentation with acute biliary type pain and acute LFT elevations, S/P cholecystectomy, is typical for CBD stone(s). Her MRCP did not show CBD stone(s) or biliary dilation however images were degraded by patient motion. LFTs remain elevated today and her pain persists, not well controlled. Clinical suspicion for CBD stone(s) is very high despite negative MRCP.   Recommend transfer to a higher level of care for ERCP in a patient with a biliopancreatic diversion and duodenal switch. [facility] has been contacted and there are no beds for transfer. A call needs to be made every morning to [facility] to remain active on their transfer list.  A day transfer, returning to [facility] after ERCP, is possible and MRCP images need to be sent by PowerShare to [facility] main campus.   Lucio Edward, MD FACG [phone]

## 2020-07-23 NOTE — Progress Notes (Signed)
PROGRESS NOTE  Monique Floyd ZSW:[id number] DOB: [birth year] DOA: 07/21/2020 PCP: Iva Boop, MD   LOS: 1 day   Brief narrative:  Monique Floyd is a 40 y.o. female with medical history significant for previous knees arthritis, OSA with CPAP, morbid obesity status post bariatric surgery, laparoscopic pancreatic diversion, laparoscopic removal of lap band, laparoscopy cholecystectomy performed in 2019, laparoscopic repair and Cheree Ditto patch, diagnostic laparoscopy and washout of the abdomen, EGD performed in 2019 at [facility] presented to hospital with sudden onset of severe epigastric pain radiating over the upper quadrants without any nausea constipation or diarrhea.  CT scan of the abdomen and pelvis without any acute findings.  MRI angiogram of the chest abdomen with negative as well.  Despite this she persisted to have abdominal pain and was admitted to hospital.  Lab work was normal as well.  GI was consulted and patient was admitted hospital for further evaluation and treatment.  Assessment/Plan:  Active Problems:   Abdominal pain  Severe epigastric pain/elevated LFTs, unclear etiology GI on board.  CT scan MRI negative. MRCP also unremarkable.  History of bariatric surgery laparoscopic pancreatic diabetes and removal of lap band and laparoscopic cholecystectomy in the past.  Her pain is worsened.  Some of the LFTs have improved but others have worsened.  GI recommends ERCP however due to her history of aortic surgery, GI here is unable to perform ERCP and has recommended transferring to tertiary care center.  Since patient has had her surgery done at [facility], I called them and I was informed that they do not have any beds to accept this patient however they requested her imaging studies to be pushed to them so that hospitalist can discuss with advanced biliary team and will get back to [place].  They however said that they will consider day trip for her if biliary team agrees.  I discussed  with GI Dr. Russella Dar and requested him to reach out to GI folks at [facility] or [place].  He will call [facility] and will let me know.  We will continue current pain management.  Repeat LFTs in the morning.  Rule out of acute viral hepatitis.   Presumptive UTI, POA Urinalysis positive for pyuria.  Continue Keflex.  Continue IV fluids   History of morbid obesity post bariatric surgery Current BMI 27. Patient follows up with surgeons at [facility]  DVT prophylaxis: enoxaparin (LOVENOX) injection 40 mg Start: 07/21/20 2200 SCDs Start: 07/21/20 1946   Code Status: Full code  Family Communication:  None  Status is: Inpatient  Remains inpatient appropriate because:IV treatments appropriate due to intensity of illness or inability to take PO and Inpatient level of care appropriate due to severity of illness   Dispo:  Patient From: Home  Planned Disposition: Home  Medically stable for discharge: No     Consultants:  GI  Procedures:  None so far  Anti-infectives: . Keflex  Anti-infectives (From admission, onward)   Start     Dose/Rate Route Frequency Ordered Stop   07/21/20 2030  cephALEXin (KEFLEX) capsule 500 mg        500 mg Oral Every 8 hours 07/21/20 1958 07/26/20 2159      Subjective: Seen and examined.  Complains of worsened abdominal pain compared to yesterday.  No other complaint.  Objective: Vitals:   07/22/20 2029 07/23/20 1412  BP: 101/61 101/61  Pulse: (!) 58 (!) 44  Resp: 19 20  Temp: 98.5 F (36.9 C) 97.9 F (36.6 C)  SpO2: Marland Kitchen)  89% 100%    Intake/Output Summary (Last 24 hours) at 07/23/2020 1437 Last data filed at 07/23/2020 1145 Gross per 24 hour  Intake 0 ml  Output --  Net 0 ml   Filed Weights   07/21/20 0857  Weight: 65.8 kg   Body mass index is 27.4 kg/m.   Physical Exam: General exam: Appears in pain Respiratory system: Clear to auscultation. Respiratory effort normal. Cardiovascular system: S1 & S2 heard, sinus bradycardia,. No JVD, murmurs, rubs,  gallops or clicks. No pedal edema. Gastrointestinal system: Abdomen is nondistended, soft but severely tender at epigastrium and right upper quadrant,. No organomegaly or masses felt. Normal bowel sounds heard. Central nervous system: Alert and oriented. No focal neurological deficits. Extremities: Symmetric 5 x 5 power. Skin: No rashes, lesions or ulcers.  Psychiatry: Judgement and insight appear normal. Mood & affect appropriate.    Data Review: I have personally reviewed the following laboratory data and studies,  CBC: Recent Labs  Lab 07/21/20 0927 07/22/20 0157 07/23/20 0333  WBC 7.7 7.4 6.1  NEUTROABS 4.5  --   --   HGB 15.6* 12.6 12.1  HCT 47.7* 38.4 37.3  MCV 99.4 100.3* 99.7  PLT 263 203 186   Basic Metabolic Panel: Recent Labs  Lab 07/21/20 0927 07/22/20 0157 07/23/20 0333  NA 140 135 136  K 4.3 4.2 4.0  CL 109 109 104  CO2 22 18* 21*  GLUCOSE 89 71 60*  BUN 8 11 12   CREATININE 0.64 0.64 0.73  CALCIUM 9.0 8.5* 8.6*  MG  --  1.9  --   PHOS  --  5.1*  --    Liver Function Tests: Recent Labs  Lab 07/21/20 0927 07/22/20 0157 07/23/20 0333  AST 39 301* 227*  ALT 34 173* 201*  ALKPHOS 77 150* 192*  BILITOT 1.3* 1.4* 0.9  PROT 6.7 5.3* 5.3*  ALBUMIN 4.2 3.2* 3.2*   Recent Labs  Lab 07/21/20 0927 07/22/20 0157  LIPASE 32 30   No results for input(s): AMMONIA in the last 168 hours. Cardiac Enzymes: No results for input(s): CKTOTAL, CKMB, CKMBINDEX, TROPONINI in the last 168 hours. BNP (last 3 results) No results for input(s): BNP in the last 8760 hours.  ProBNP (last 3 results) No results for input(s): PROBNP in the last 8760 hours.  CBG: No results for input(s): GLUCAP in the last 168 hours. Recent Results (from the past 240 hour(s))  Urine culture     Status: Abnormal   Collection Time: 07/21/20 10:50 AM   Specimen: Urine, Random  Result Value Ref Range Status   Specimen Description URINE, RANDOM  Final   Special Requests   Final     NONE Performed at [facility], [address] [place], [place] [address]    Culture MULTIPLE SPECIES PRESENT, SUGGEST RECOLLECTION (A)  Final   Report Status 07/22/2020 FINAL  Final  Resp Panel by RT-PCR (Flu A&B, Covid) Nasopharyngeal Swab     Status: None   Collection Time: 07/21/20  1:15 PM   Specimen: Nasopharyngeal Swab; Nasopharyngeal(NP) swabs in vial transport medium  Result Value Ref Range Status   SARS Coronavirus 2 by RT PCR NEGATIVE NEGATIVE Final    Comment: (NOTE) SARS-CoV-2 target nucleic acids are NOT DETECTED.  The SARS-CoV-2 RNA is generally detectable in upper respiratory specimens during the acute phase of infection. The lowest concentration of SARS-CoV-2 viral copies this assay can detect is 138 copies/mL. A negative result does not preclude SARS-Cov-2 infection and should  not be used as the sole basis for treatment or other patient management decisions. A negative result may occur with  improper specimen collection/handling, submission of specimen other than nasopharyngeal swab, presence of viral mutation(s) within the areas targeted by this assay, and inadequate number of viral copies(<138 copies/mL). A negative result must be combined with clinical observations, patient history, and epidemiological information. The expected result is Negative.  Fact Sheet for Patients:  BloggerCourse.com  Fact Sheet for Healthcare Providers:  SeriousBroker.it  This test is no t yet approved or cleared by the [place] FDA and  has been authorized for detection and/or diagnosis of SARS-CoV-2 by FDA under an Emergency Use Authorization (EUA). This EUA will remain  in effect (meaning this test can be used) for the duration of the COVID-19 declaration under Section 564(b)(1) of the Act, 21 U.S.C.section 360bbb-3(b)(1), unless the authorization is terminated  or revoked sooner.       Influenza A by PCR NEGATIVE  NEGATIVE Final   Influenza B by PCR NEGATIVE NEGATIVE Final    Comment: (NOTE) The Xpert Xpress SARS-CoV-2/FLU/RSV plus assay is intended as an aid in the diagnosis of influenza from Nasopharyngeal swab specimens and should not be used as a sole basis for treatment. Nasal washings and aspirates are unacceptable for Xpert Xpress SARS-CoV-2/FLU/RSV testing.  Fact Sheet for Patients: BloggerCourse.com  Fact Sheet for Healthcare Providers: SeriousBroker.it  This test is not yet approved or cleared by the [place] FDA and has been authorized for detection and/or diagnosis of SARS-CoV-2 by FDA under an Emergency Use Authorization (EUA). This EUA will remain in effect (meaning this test can be used) for the duration of the COVID-19 declaration under Section 564(b)(1) of the Act, 21 U.S.C. section 360bbb-3(b)(1), unless the authorization is terminated or revoked.  Performed at [facility], [address], [place], [place] [address]      Studies: MR ANGIO CHEST WO CONTRAST  Result Date: 07/21/2020 CLINICAL DATA:  Chest and abdominal pain EXAM: MRA ABDOMEN WITHOUT CONTRAST; MRA CHEST WITHOUT CONTRAST TECHNIQUE: Angiographic images of the chest and abdomen were obtained using MRA technique without intravenous contrast. CONTRAST:  None COMPARISON:  CT of the abdomen and pelvis from earlier in the same day. FINDINGS: MRA of the chest: Aorta: Examination is somewhat limited due to cardiac and pulsatile artifact. No aneurysmal dilatation of the thoracic aorta is noted. Segmental visualization of the aorta is seen without evidence of dissection. Heart: No cardiac enlargement is noted. Pulmonary Arteries:  Pulmonary arteries are grossly unremarkable. Other: Lungs are well aerated bilaterally. No bony abnormality is noted. Surrounding soft tissues are within normal limits. No sizable hilar or mediastinal adenopathy is noted. MRA of the abdomen:  Abdominal aorta is well visualized without aneurysmal dilatation or dissection. Celiac axis and superior mesenteric artery are widely patent hepatic venous and portal venous system are within normal limits. Limited evaluation of the liver, spleen, pancreas, adrenal glands and kidneys shows no acute abnormality. Left retroaortic renal vein is noted. No acute bony abnormality is noted. IMPRESSION: Somewhat limited exam due to motion artifact and lack of IV contrast. No findings to suggest the thoracic or abdominal aortic aneurysm or dissection are seen. No acute abnormality is noted. Electronically Signed   By: Alcide Clever M.D.   On: 07/21/2020 18:55   MR ANGIO ABDOMEN WO CONTRAST  Result Date: 07/21/2020 CLINICAL DATA:  Chest and abdominal pain EXAM: MRA ABDOMEN WITHOUT CONTRAST; MRA CHEST WITHOUT CONTRAST TECHNIQUE: Angiographic images of the chest  and abdomen were obtained using MRA technique without intravenous contrast. CONTRAST:  None COMPARISON:  CT of the abdomen and pelvis from earlier in the same day. FINDINGS: MRA of the chest: Aorta: Examination is somewhat limited due to cardiac and pulsatile artifact. No aneurysmal dilatation of the thoracic aorta is noted. Segmental visualization of the aorta is seen without evidence of dissection. Heart: No cardiac enlargement is noted. Pulmonary Arteries:  Pulmonary arteries are grossly unremarkable. Other: Lungs are well aerated bilaterally. No bony abnormality is noted. Surrounding soft tissues are within normal limits. No sizable hilar or mediastinal adenopathy is noted. MRA of the abdomen: Abdominal aorta is well visualized without aneurysmal dilatation or dissection. Celiac axis and superior mesenteric artery are widely patent hepatic venous and portal venous system are within normal limits. Limited evaluation of the liver, spleen, pancreas, adrenal glands and kidneys shows no acute abnormality. Left retroaortic renal vein is noted. No acute bony abnormality  is noted. IMPRESSION: Somewhat limited exam due to motion artifact and lack of IV contrast. No findings to suggest the thoracic or abdominal aortic aneurysm or dissection are seen. No acute abnormality is noted. Electronically Signed   By: Alcide Clever M.D.   On: 07/21/2020 18:55   MR ABDOMEN MRCP W WO CONTAST  Result Date: 07/22/2020 CLINICAL DATA:  Abdominal pain and elevated liver function test. Post bariatric surgery. Prior removal of lap band. Prior cholecystectomy. EXAM: MRI ABDOMEN WITHOUT AND WITH CONTRAST (INCLUDING MRCP) TECHNIQUE: Multiplanar multisequence MR imaging of the abdomen was performed both before and after the administration of intravenous contrast. Heavily T2-weighted images of the biliary and pancreatic ducts were obtained, and three-dimensional MRCP images were rendered by post processing. CONTRAST:  6.22mL GADAVIST GADOBUTROL 1 MMOL/ML IV SOLN COMPARISON:  CT 07/21/2020 FINDINGS: Lower chest:  Lung bases are clear. Hepatobiliary: Postcholecystectomy anatomy. No significant intrahepatic duct dilatation. The common hepatic duct measures 7 mm. The common bile duct measures 5 mm (image 17/series 4). No filling defect within the common bile duct MRCP sequences are degraded by patient motion. Opposed phase imaging demonstrates no significant hepatic steatosis (series 11). There is however loss of signal intensity along the inferior margin of the liver on out of phase imaging consistent with focal fatty infiltration (image 22/series 11). Postcontrast enhanced imaging demonstrates no enhancing hepatic lesion. Pancreas: Normal pancreatic duct. No variant pancreatic ductal anatomy. No pancreatic inflammation or lesion. Spleen: Normal spleen. Adrenals/urinary tract: Adrenal glands and kidneys are normal. Stomach/Bowel: Post bariatric surgery with duodenal biliary diversion. Natalie difficult to follow on MRI due to bowel movement. Vascular/Lymphatic: Abdominal aortic normal caliber. No  retroperitoneal periportal lymphadenopathy. Musculoskeletal: No aggressive osseous lesion IMPRESSION: 1. Normal biliary tree post cholecystectomy. No choledocholithiasis. 2. No pancreatic inflammation.  Normal pancreatic ductal anatomy. 3. Focal fatty infiltration along the inferior margin the RIGHT hepatic lobe. 4. Bariatric surgery anatomy difficult define on MRI. Electronically Signed   By: Genevive Bi M.D.   On: 07/22/2020 18:29      Hughie Closs, MD  [facility] 07/23/2020  If 7PM-7AM, please contact night-coverage

## 2020-07-24 ENCOUNTER — Other Ambulatory Visit (HOSPITAL_COMMUNITY): Payer: BC Managed Care – PPO

## 2020-07-24 ENCOUNTER — Inpatient Hospital Stay (HOSPITAL_COMMUNITY): Payer: BC Managed Care – PPO

## 2020-07-24 DIAGNOSIS — R1013 Epigastric pain: Secondary | ICD-10-CM | POA: Diagnosis not present

## 2020-07-24 DIAGNOSIS — R7989 Other specified abnormal findings of blood chemistry: Secondary | ICD-10-CM | POA: Diagnosis not present

## 2020-07-24 LAB — COMPREHENSIVE METABOLIC PANEL
ALT: 128 U/L — ABNORMAL HIGH (ref 0–44)
AST: 64 U/L — ABNORMAL HIGH (ref 15–41)
Albumin: 3.1 g/dL — ABNORMAL LOW (ref 3.5–5.0)
Alkaline Phosphatase: 170 U/L — ABNORMAL HIGH (ref 38–126)
Anion gap: 14 (ref 5–15)
BUN: 9 mg/dL (ref 6–20)
CO2: 17 mmol/L — ABNORMAL LOW (ref 22–32)
Calcium: 8.4 mg/dL — ABNORMAL LOW (ref 8.9–10.3)
Chloride: 104 mmol/L (ref 98–111)
Creatinine, Ser: 0.75 mg/dL (ref 0.44–1.00)
GFR, Estimated: >60 mL/min (ref >60)
Glucose, Bld: 51 mg/dL — ABNORMAL LOW (ref 70–99)
Potassium: 4 mmol/L (ref 3.5–5.1)
Sodium: 135 mmol/L (ref 135–145)
Total Bilirubin: 1.2 mg/dL (ref 0.3–1.2)
Total Protein: 5.1 g/dL — ABNORMAL LOW (ref 6.5–8.1)

## 2020-07-24 LAB — CBC
HCT: 37.2 % (ref 36.0–46.0)
Hemoglobin: 12.1 g/dL (ref 12.0–15.0)
MCH: 32.4 pg (ref 26.0–34.0)
MCHC: 32.5 g/dL (ref 30.0–36.0)
MCV: 99.7 fL (ref 80.0–100.0)
Platelets: 171 10*3/uL (ref 150–400)
RBC: 3.73 MIL/uL — ABNORMAL LOW (ref 3.87–5.11)
RDW: 12.4 % (ref 11.5–15.5)
WBC: 5.3 10*3/uL (ref 4.0–10.5)
nRBC: 0 % (ref 0.0–0.2)

## 2020-07-24 MED ORDER — PANTOPRAZOLE SODIUM 40 MG IV SOLR
40.0000 mg | INTRAVENOUS | Status: DC
  Administered 2020-07-24: 40 mg via INTRAVENOUS
  Filled 2020-07-24: qty 40

## 2020-07-24 NOTE — Progress Notes (Signed)
PROGRESS NOTE  Monique Floyd OJJ:[id number] DOB: [birth year] DOA: 07/21/2020 PCP: Gatha Mayer, MD   LOS: 2 days   Brief narrative:  Monique Floyd is a 40 y.o. female with medical history significant for previous knees arthritis, OSA with CPAP, morbid obesity status post bariatric surgery, laparoscopic pancreatic diversion, laparoscopic removal of lap band, laparoscopy cholecystectomy performed in 2019, laparoscopic repair and Phillip Heal patch, diagnostic laparoscopy and washout of the abdomen, EGD performed in 2019 at [facility] presented to hospital with sudden onset of severe epigastric pain radiating over the upper quadrants without any nausea constipation or diarrhea.  CT scan of the abdomen and pelvis without any acute findings.  MRI angiogram of the chest abdomen with negative as well.  Despite this she persisted to have abdominal pain and was admitted to hospital.  Lab work was normal as well.  GI was consulted and patient was admitted hospital for further evaluation and treatment.  Assessment/Plan:  Active Problems:   Abdominal pain   Elevated LFTs   Abdominal pain, epigastric  Severe epigastric pain/elevated LFTs, unclear etiology GI on board.  CT scan MRI negative. MRCP also unremarkable.  History of bariatric surgery laparoscopic pancreatic diabetes and removal of lap band and laparoscopic cholecystectomy in the past.  Her pain is worsened.  Some of the LFTs have improved but others have worsened.  GI recommends ERCP however due to her history of gastric bypass surgery and multiple other surgeries, GI here is unable to perform ERCP and has recommended transferring to tertiary care center.  Since patient has had her surgeries done at [facility], I called them and I was informed that they do not have any beds to accept this patient.  GI called him again this morning and still no bed.  It appears that Dr. Fuller Plan has also discussed with his colleague at [facility] and they also do not offer ERCP  for such patients.  GI has ordered ultrasound abdomen and consulted IR for PTC, balloon employed dilatation and CBD sweep.  Appreciate GI help.  Patient's LFTs however have improved somewhat today.   Presumptive UTI, POA Urinalysis positive for pyuria.  Continue Keflex.  Continue IV fluids   History of morbid obesity post bariatric surgery Current BMI 27. Patient follows up with surgeons at [facility]  DVT prophylaxis: SCDs Start: 07/21/20 1946   Code Status: Full code  Family Communication:  None  Status is: Inpatient  Remains inpatient appropriate because:IV treatments appropriate due to intensity of illness or inability to take PO and Inpatient level of care appropriate due to severity of illness   Dispo:  Patient From: Home  Planned Disposition: Home  Medically stable for discharge: No     Consultants:  GI  Procedures:  None so far  Anti-infectives: . Keflex  Anti-infectives (From admission, onward)   Start     Dose/Rate Route Frequency Ordered Stop   07/21/20 2030  cephALEXin (KEFLEX) capsule 500 mg        500 mg Oral Every 8 hours 07/21/20 1958 07/26/20 2159      Subjective: Patient seen and examined.  Still complains of abdominal pain, no better than yesterday however she looks somewhat better than yesterday.  Objective: Vitals:   07/23/20 2017 07/24/20 0402  BP: 115/66 120/63  Pulse: (!) 45 (!) 46  Resp: 16 16  Temp: 98.1 F (36.7 C) 98.1 F (36.7 C)  SpO2: 97% 99%    Intake/Output Summary (Last 24 hours) at 07/24/2020 1426 Last data filed at 07/24/2020  1300 Gross per 24 hour  Intake 993.99 ml  Output --  Net 993.99 ml   Filed Weights   07/21/20 0857  Weight: 65.8 kg   Body mass index is 27.4 kg/m.   Physical Exam: General exam: Appears calm and comfortable  Respiratory system: Clear to auscultation. Respiratory effort normal. Cardiovascular system: S1 & S2 heard, RRR. No JVD, murmurs, rubs, gallops or clicks. No pedal  edema. Gastrointestinal system: Abdomen is nondistended, soft and severe abdominal tenderness which is generalized but more pronounced in right upper quadrant and epigastrium, however improved compared to yesterday. No organomegaly or masses felt. Normal bowel sounds heard. Central nervous system: Alert and oriented. No focal neurological deficits. Extremities: Symmetric 5 x 5 power. Skin: No rashes, lesions or ulcers.  Psychiatry: Judgement and insight appear normal. Mood & affect appropriate.    Data Review: I have personally reviewed the following laboratory data and studies,  CBC: Recent Labs  Lab 07/21/20 0927 07/22/20 0157 07/23/20 0333 07/24/20 0425  WBC 7.7 7.4 6.1 5.3  NEUTROABS 4.5  --   --   --   HGB 15.6* 12.6 12.1 12.1  HCT 47.7* 38.4 37.3 37.2  MCV 99.4 100.3* 99.7 99.7  PLT 263 203 186 448   Basic Metabolic Panel: Recent Labs  Lab 07/21/20 0927 07/22/20 0157 07/23/20 0333 07/24/20 0425  NA 140 135 136 135  K 4.3 4.2 4.0 4.0  CL 109 109 104 104  CO2 22 18* 21* 17*  GLUCOSE 89 71 60* 51*  BUN _0 CREATININE 0.64 0.64 0.73 0.75  CALCIUM 9.0 8.5* 8.6* 8.4*  MG  --  1.9  --   --   PHOS  --  5.1*  --   --    Liver Function Tests: Recent Labs  Lab 07/21/20 0927 07/22/20 0157 07/23/20 0333 07/24/20 0425  AST 39 301* 227* 64*  ALT 34 173* 201* 128*  ALKPHOS 77 150* 192* 170*  BILITOT 1.3* 1.4* 0.9 1.2  PROT 6.7 5.3* 5.3* 5.1*  ALBUMIN 4.2 3.2* 3.2* 3.1*   Recent Labs  Lab 07/21/20 0927 07/22/20 0157  LIPASE 32 30   No results for input(s): AMMONIA in the last 168 hours. Cardiac Enzymes: No results for input(s): CKTOTAL, CKMB, CKMBINDEX, TROPONINI in the last 168 hours. BNP (last 3 results) No results for input(s): BNP in the last 8760 hours.  ProBNP (last 3 results) No results for input(s): PROBNP in the last 8760 hours.  CBG: No results for input(s): GLUCAP in the last 168 hours. Recent Results (from the past 240 hour(s))  Urine  culture     Status: Abnormal   Collection Time: 07/21/20 10:50 AM   Specimen: Urine, Random  Result Value Ref Range Status   Specimen Description URINE, RANDOM  Final   Special Requests   Final    NONE Performed at [facility], [address] [place], [place] [address]    Culture MULTIPLE SPECIES PRESENT, SUGGEST RECOLLECTION (A)  Final   Report Status 07/22/2020 FINAL  Final  Resp Panel by RT-PCR (Flu A&B, Covid) Nasopharyngeal Swab     Status: None   Collection Time: 07/21/20  1:15 PM   Specimen: Nasopharyngeal Swab; Nasopharyngeal(NP) swabs in vial transport medium  Result Value Ref Range Status   SARS Coronavirus 2 by RT PCR NEGATIVE NEGATIVE Final    Comment: (NOTE) SARS-CoV-2 target nucleic acids are NOT DETECTED.  The SARS-CoV-2 RNA is generally detectable in upper respiratory specimens during  the acute phase of infection. The lowest concentration of SARS-CoV-2 viral copies this assay can detect is 138 copies/mL. A negative result does not preclude SARS-Cov-2 infection and should not be used as the sole basis for treatment or other patient management decisions. A negative result may occur with  improper specimen collection/handling, submission of specimen other than nasopharyngeal swab, presence of viral mutation(s) within the areas targeted by this assay, and inadequate number of viral copies(<138 copies/mL). A negative result must be combined with clinical observations, patient history, and epidemiological information. The expected result is Negative.  Fact Sheet for Patients:  EntrepreneurPulse.com.au  Fact Sheet for Healthcare Providers:  IncredibleEmployment.be  This test is no t yet approved or cleared by the [place] FDA and  has been authorized for detection and/or diagnosis of SARS-CoV-2 by FDA under an Emergency Use Authorization (EUA). This EUA will remain  in effect (meaning this test can be used) for the  duration of the COVID-19 declaration under Section 564(b)(1) of the Act, 21 U.S.C.section 360bbb-3(b)(1), unless the authorization is terminated  or revoked sooner.       Influenza A by PCR NEGATIVE NEGATIVE Final   Influenza B by PCR NEGATIVE NEGATIVE Final    Comment: (NOTE) The Xpert Xpress SARS-CoV-2/FLU/RSV plus assay is intended as an aid in the diagnosis of influenza from Nasopharyngeal swab specimens and should not be used as a sole basis for treatment. Nasal washings and aspirates are unacceptable for Xpert Xpress SARS-CoV-2/FLU/RSV testing.  Fact Sheet for Patients: EntrepreneurPulse.com.au  Fact Sheet for Healthcare Providers: IncredibleEmployment.be  This test is not yet approved or cleared by the [place] FDA and has been authorized for detection and/or diagnosis of SARS-CoV-2 by FDA under an Emergency Use Authorization (EUA). This EUA will remain in effect (meaning this test can be used) for the duration of the COVID-19 declaration under Section 564(b)(1) of the Act, 21 U.S.C. section 360bbb-3(b)(1), unless the authorization is terminated or revoked.  Performed at [facility], [place] [address], [place], [place] [address]      Studies: MR ABDOMEN MRCP W WO CONTAST  Result Date: 07/22/2020 CLINICAL DATA:  Abdominal pain and elevated liver function test. Post bariatric surgery. Prior removal of lap band. Prior cholecystectomy. EXAM: MRI ABDOMEN WITHOUT AND WITH CONTRAST (INCLUDING MRCP) TECHNIQUE: Multiplanar multisequence MR imaging of the abdomen was performed both before and after the administration of intravenous contrast. Heavily T2-weighted images of the biliary and pancreatic ducts were obtained, and three-dimensional MRCP images were rendered by post processing. CONTRAST:  6.21m GADAVIST GADOBUTROL 1 MMOL/ML IV SOLN COMPARISON:  CT 07/21/2020 FINDINGS: Lower chest:  Lung bases are clear. Hepatobiliary:  Postcholecystectomy anatomy. No significant intrahepatic duct dilatation. The common hepatic duct measures 7 mm. The common bile duct measures 5 mm (image 17/series 4). No filling defect within the common bile duct MRCP sequences are degraded by patient motion. Opposed phase imaging demonstrates no significant hepatic steatosis (series 11). There is however loss of signal intensity along the inferior margin of the liver on out of phase imaging consistent with focal fatty infiltration (image 22/series 11). Postcontrast enhanced imaging demonstrates no enhancing hepatic lesion. Pancreas: Normal pancreatic duct. No variant pancreatic ductal anatomy. No pancreatic inflammation or lesion. Spleen: Normal spleen. Adrenals/urinary tract: Adrenal glands and kidneys are normal. Stomach/Bowel: Post bariatric surgery with duodenal biliary diversion. Natalie difficult to follow on MRI due to bowel movement. Vascular/Lymphatic: Abdominal aortic normal caliber. No retroperitoneal periportal lymphadenopathy. Musculoskeletal: No aggressive osseous lesion IMPRESSION: 1. Normal  biliary tree post cholecystectomy. No choledocholithiasis. 2. No pancreatic inflammation.  Normal pancreatic ductal anatomy. 3. Focal fatty infiltration along the inferior margin the RIGHT hepatic lobe. 4. Bariatric surgery anatomy difficult define on MRI. Electronically Signed   By: Suzy Bouchard M.D.   On: 07/22/2020 18:29      Darliss Cheney, MD  [facility] 07/24/2020  If 7PM-7AM, please contact night-coverage

## 2020-07-24 NOTE — Progress Notes (Addendum)
Daily Rounding Note  07/24/2020, 9:05 AM  LOS: 2 days   SUBJECTIVE:   Chief complaint: Abdominal pain.  Elevated LFTs.     Pain persists, severe at times.  No further nausea or vomiting.  No bowel movements for at least a couple of days.  OBJECTIVE:         Vital signs in last 24 hours:    Temp:  [97.9 F (36.6 C)-98.1 F (36.7 C)] 98.1 F (36.7 C) (05/12 0402) Pulse Rate:  [44-46] 46 (05/12 0402) Resp:  [16-20] 16 (05/12 0402) BP: (101-120)/(61-66) 120/63 (05/12 0402) SpO2:  [97 %-100 %] 99 % (05/12 0402) Last BM Date:  (PTA) Filed Weights   07/21/20 0857  Weight: 65.8 kg   General: Anxious, alert.  Does not look toxic or ill just uncomfortable. Heart: RRR. Chest: No labored breathing, no cough, lungs clear bilaterally Abdomen: Soft.  Active bowel sounds.  No distention.  Patient's response to mild pressure out of proportion to exam (no guarding, no rebound) Extremities: No CCE Neuro/Psych: Alert.  Oriented x3.  Moves all 4 limbs.  No tremor.  Intake/Output from previous day: 05/11 0701 - 05/12 0700 In: 994 [I.V.:994] Out: -   Intake/Output this shift: No intake/output data recorded.  Lab Results: Recent Labs    07/22/20 0157 07/23/20 0333 07/24/20 0425  WBC 7.4 6.1 5.3  HGB 12.6 12.1 12.1  HCT 38.4 37.3 37.2  PLT 203 186 171   BMET Recent Labs    07/22/20 0157 07/23/20 0333 07/24/20 0425  NA 135 136 135  K 4.2 4.0 4.0  CL 109 104 104  CO2 18* 21* 17*  GLUCOSE 71 60* 51*  BUN $Re'11 12 9  'AxX$ CREATININE 0.64 0.73 0.75  CALCIUM 8.5* 8.6* 8.4*   LFT Recent Labs    07/22/20 0157 07/23/20 0333 07/24/20 0425  PROT 5.3* 5.3* 5.1*  ALBUMIN 3.2* 3.2* 3.1*  AST 301* 227* 64*  ALT 173* 201* 128*  ALKPHOS 150* 192* 170*  BILITOT 1.4* 0.9 1.2   PT/INR No results for input(s): LABPROT, INR in the last 72 hours. Hepatitis Panel Recent Labs    07/23/20 1109  HEPBSAG NON REACTIVE  HCVAB NON  REACTIVE  HEPAIGM NON REACTIVE  HEPBIGM NON REACTIVE    Studies/Results: MR ABDOMEN MRCP W WO CONTAST  Result Date: 07/22/2020 CLINICAL DATA:  Abdominal pain and elevated liver function test. Post bariatric surgery. Prior removal of lap band. Prior cholecystectomy. EXAM: MRI ABDOMEN WITHOUT AND WITH CONTRAST (INCLUDING MRCP) TECHNIQUE: Multiplanar multisequence MR imaging of the abdomen was performed both before and after the administration of intravenous contrast. Heavily T2-weighted images of the biliary and pancreatic ducts were obtained, and three-dimensional MRCP images were rendered by post processing. CONTRAST:  6.68mL GADAVIST GADOBUTROL 1 MMOL/ML IV SOLN COMPARISON:  CT 07/21/2020 FINDINGS: Lower chest:  Lung bases are clear. Hepatobiliary: Postcholecystectomy anatomy. No significant intrahepatic duct dilatation. The common hepatic duct measures 7 mm. The common bile duct measures 5 mm (image 17/series 4). No filling defect within the common bile duct MRCP sequences are degraded by patient motion. Opposed phase imaging demonstrates no significant hepatic steatosis (series 11). There is however loss of signal intensity along the inferior margin of the liver on out of phase imaging consistent with focal fatty infiltration (image 22/series 11). Postcontrast enhanced imaging demonstrates no enhancing hepatic lesion. Pancreas: Normal pancreatic duct. No variant pancreatic ductal anatomy. No pancreatic inflammation or lesion. Spleen: Normal spleen. Adrenals/urinary  tract: Adrenal glands and kidneys are normal. Stomach/Bowel: Post bariatric surgery with duodenal biliary diversion. Natalie difficult to follow on MRI due to bowel movement. Vascular/Lymphatic: Abdominal aortic normal caliber. No retroperitoneal periportal lymphadenopathy. Musculoskeletal: No aggressive osseous lesion IMPRESSION: 1. Normal biliary tree post cholecystectomy. No choledocholithiasis. 2. No pancreatic inflammation.  Normal  pancreatic ductal anatomy. 3. Focal fatty infiltration along the inferior margin the RIGHT hepatic lobe. 4. Bariatric surgery anatomy difficult define on MRI. Electronically Signed   By: Suzy Bouchard M.D.   On: 07/22/2020 18:29    Scheduled Meds: . cephALEXin  500 mg Oral Q8H  . pantoprazole (PROTONIX) IV  40 mg Intravenous BID  . senna-docusate  2 tablet Oral QHS   Continuous Infusions: . lactated ringers 101 mL/hr at 07/24/20 0327   PRN Meds:.acetaminophen, HYDROmorphone (DILAUDID) injection, melatonin, ondansetron (ZOFRAN) IV, oxyCODONE   ASSESMENT:   *   Abdominal pain, elevated LFTs. No source for symptoms on noncontrast CT which showed focal hypoattenuation in segment 4B of the liver, likely focal fat.  Changes consistent with previous gastric bypass and prior cholecystectomy. MRI/MRCP: Biliary tree postcholecystectomy normal.  No choledocholithiasis.  No pancreatic inflammation, PD normal.  Focal fatty infiltration in inferior margin right hepatic lobe. LFTs improved. Lipase consistently normal.  Acute hepatitis serologies negative. Despite findings on MRI and CT, suspect choledocholithiasis.  *   S/p bariatric biliopancreatic diversion, duodenal switch, repair duodenal ileostomy leak, cholecystectomy 11/2017 in [place].  Anatomy makes for challenging ERCP or EUS.  *   Anxiety.  This is impacting her pain.     PLAN   *    Working on transfer to tertiary care center for consideration of ERCP.  At present no beds available for transfer to [facility] today.  Will reach out to [facility].  *   Allow clear trays if tolerated, ordered.   Monique Floyd  07/24/2020, 9:05 AM Phone [phone]    Attending Physician Note   I have taken an interval history, reviewed the chart and examined the patient. I agree with the Advanced Practitioner's note, impression and recommendations.   I spoke with Dr. Jola Schmidt at [facility]. They no longer offer ERCP following duodenal switch surgery as  they have not had success. [facility] transfer has been requested. RUQ [place] today to assess for duct dilation. Consult IR for consideration of PTC, balloon ampulla dilation, CBD sweep.   Lucio Edward, MD FACG [phone]

## 2020-07-25 ENCOUNTER — Inpatient Hospital Stay (HOSPITAL_COMMUNITY): Payer: BC Managed Care – PPO

## 2020-07-25 DIAGNOSIS — R7989 Other specified abnormal findings of blood chemistry: Secondary | ICD-10-CM | POA: Diagnosis not present

## 2020-07-25 DIAGNOSIS — R1013 Epigastric pain: Secondary | ICD-10-CM | POA: Diagnosis not present

## 2020-07-25 LAB — COMPREHENSIVE METABOLIC PANEL
ALT: 97 U/L — ABNORMAL HIGH (ref 0–44)
AST: 47 U/L — ABNORMAL HIGH (ref 15–41)
Albumin: 3.1 g/dL — ABNORMAL LOW (ref 3.5–5.0)
Alkaline Phosphatase: 155 U/L — ABNORMAL HIGH (ref 38–126)
Anion gap: 9 (ref 5–15)
BUN: 5 mg/dL — ABNORMAL LOW (ref 6–20)
CO2: 22 mmol/L (ref 22–32)
Calcium: 8.5 mg/dL — ABNORMAL LOW (ref 8.9–10.3)
Chloride: 107 mmol/L (ref 98–111)
Creatinine, Ser: 0.63 mg/dL (ref 0.44–1.00)
GFR, Estimated: >60 mL/min (ref >60)
Glucose, Bld: 84 mg/dL (ref 70–99)
Potassium: 3.9 mmol/L (ref 3.5–5.1)
Sodium: 138 mmol/L (ref 135–145)
Total Bilirubin: 0.9 mg/dL (ref 0.3–1.2)
Total Protein: 5.2 g/dL — ABNORMAL LOW (ref 6.5–8.1)

## 2020-07-25 LAB — HEPATIC FUNCTION PANEL
ALT: 97 U/L — ABNORMAL HIGH (ref 0–44)
AST: 48 U/L — ABNORMAL HIGH (ref 15–41)
Albumin: 3.2 g/dL — ABNORMAL LOW (ref 3.5–5.0)
Alkaline Phosphatase: 142 U/L — ABNORMAL HIGH (ref 38–126)
Bilirubin, Direct: 0.1 mg/dL (ref 0.0–0.2)
Indirect Bilirubin: 0.7 mg/dL (ref 0.3–0.9)
Total Bilirubin: 0.8 mg/dL (ref 0.3–1.2)
Total Protein: 5.3 g/dL — ABNORMAL LOW (ref 6.5–8.1)

## 2020-07-25 LAB — LACTIC ACID, PLASMA
Lactic Acid, Venous: 0.7 mmol/L (ref 0.5–1.9)
Lactic Acid, Venous: 0.6 mmol/L (ref 0.5–1.9)

## 2020-07-25 LAB — LIPASE, BLOOD: Lipase: 26 U/L (ref 11–51)

## 2020-07-25 MED ORDER — LIDOCAINE VISCOUS HCL 2 % MT SOLN
15.0000 mL | Freq: Once | OROMUCOSAL | Status: AC
  Administered 2020-07-25: 15 mL via ORAL
  Filled 2020-07-25: qty 15

## 2020-07-25 MED ORDER — ALUM & MAG HYDROXIDE-SIMETH 200-200-20 MG/5ML PO SUSP
30.0000 mL | Freq: Once | ORAL | Status: AC
  Administered 2020-07-25: 30 mL via ORAL
  Filled 2020-07-25: qty 30

## 2020-07-25 MED ORDER — CALCIUM CARBONATE-VITAMIN D 500-200 MG-UNIT PO TABS
1.0000 | ORAL_TABLET | Freq: Three times a day (TID) | ORAL | Status: DC
  Administered 2020-07-25 – 2020-07-31 (×20): 1 via ORAL
  Filled 2020-07-25 (×20): qty 1

## 2020-07-25 MED ORDER — HYDROXYZINE HCL 25 MG PO TABS
50.0000 mg | ORAL_TABLET | Freq: Once | ORAL | Status: AC
  Administered 2020-07-25: 50 mg via ORAL
  Filled 2020-07-25: qty 2

## 2020-07-25 MED ORDER — MORPHINE SULFATE (PF) 4 MG/ML IV SOLN
4.0000 mg | Freq: Once | INTRAVENOUS | Status: AC
  Administered 2020-07-25: 4 mg via INTRAVENOUS
  Filled 2020-07-25: qty 1

## 2020-07-25 MED ORDER — PANTOPRAZOLE SODIUM 40 MG IV SOLR
40.0000 mg | Freq: Two times a day (BID) | INTRAVENOUS | Status: DC
  Administered 2020-07-25 – 2020-07-28 (×7): 40 mg via INTRAVENOUS
  Filled 2020-07-25 (×7): qty 40

## 2020-07-25 MED ORDER — VITAMIN B-12 1000 MCG PO TABS
5000.0000 ug | ORAL_TABLET | Freq: Two times a day (BID) | ORAL | Status: DC
  Administered 2020-07-25 – 2020-07-31 (×12): 5000 ug via ORAL
  Filled 2020-07-25 (×12): qty 5

## 2020-07-25 MED ORDER — HYDROXYZINE HCL 25 MG PO TABS
25.0000 mg | ORAL_TABLET | Freq: Three times a day (TID) | ORAL | Status: DC | PRN
  Administered 2020-07-25 – 2020-07-29 (×6): 25 mg via ORAL
  Filled 2020-07-25 (×6): qty 1

## 2020-07-25 MED ORDER — FENTANYL CITRATE (PF) 100 MCG/2ML IJ SOLN
25.0000 ug | Freq: Once | INTRAMUSCULAR | Status: AC
Start: 2020-07-25 — End: 2020-07-25
  Administered 2020-07-25: 25 ug via INTRAVENOUS
  Filled 2020-07-25: qty 2

## 2020-07-25 MED ORDER — VITAMIN D 25 MCG (1000 UNIT) PO TABS
5000.0000 [IU] | ORAL_TABLET | Freq: Every day | ORAL | Status: DC
  Administered 2020-07-25 – 2020-07-31 (×7): 5000 [IU] via ORAL
  Filled 2020-07-25 (×7): qty 5

## 2020-07-25 NOTE — Progress Notes (Signed)
Patient reporting 10/10 RUQ burning pain. Reviewed previous CT and [place]. Patient has had dilaudid, oxy, tylenol, and GI cocktail. No relief. Repeat CT abd and pelvis wo contrast. Lactic acid pending.

## 2020-07-25 NOTE — Progress Notes (Signed)
Notified on call-provider in regards to pt's heart rate being in the upper 30s - lower 40s. Pt is asymptomatic, received IV Dilaudid for pain at 0036.  Stating her pain is worsening and different. Per pt report, pain is a "burning feeling that is different, it has gotten worse over the past two days".

## 2020-07-25 NOTE — Consult Note (Signed)
Chief Complaint: Abdominal pain. Request is for evaluation for internal external biliary drain placement  Referring Physician(s): Dr. Judie Petit. Russella Dar  Supervising Physician: Simonne Come  Patient Status: [facility] - In-pt  History of Present Illness: Monique Floyd is a 40 y.o. female History of knee arthritis, OSA ,Morbid obesity s/p bariatric surgery biliopancreatic diversion with duodenal switch s/p repair of duodenoileostomy leak Cheree Ditto patch) and cholecystectomy 9.19 at OSH. Presented to the ED at [facility] on 5.9.22 with abdominal  pain found to have elevated LFT's.  MRCP perfomed on 5.10.22 shows a normal biliary tree with  no dilation of the biliary tree or inflammation. CT abd from 5.13.21 shows no acute findings. LFT's  downtrending.  AST is now 47  (was 64, 227) and ALT is now 97 (was 128, 201). Alkaline phosphatase 155 ( was 170, 192) Total protein 5.2 (was 5.1, 5.3). Total bilirubin is 0.9. Lipase from 5.10.22 -30.   Past Medical History:  Diagnosis Date  . Abnormal Pap smear   . Anxiety   . Asthma   . Prior pregnancy complicated by PIH, antepartum     Past Surgical History:  Procedure Laterality Date  . CESAREAN SECTION      Allergies: Bee venom, Codeine, Contrast media [iodinated diagnostic agents], and Hydromorphone  Medications: Prior to Admission medications   Medication Sig Start Date End Date Taking? Authorizing Provider  Calcium Carb-Cholecalciferol (CALCIUM PLUS VITAMIN D) 500-200 MG-UNIT TABS Take 1 tablet by mouth 3 (three) times daily.   Yes [provider]  Cholecalciferol (VITAMIN D3) 125 MCG (5000 UT) TABS Take 5,000 Units by mouth daily.   Yes [provider]  Cyanocobalamin (B-12 SUPER STRENGTH) 5000 MCG/ML LIQD Place 5,000 mcg under the tongue 2 (two) times daily.   Yes [provider]  Ferrous Gluconate-C-Folic Acid (IRON-C PO) Take 1 tablet by mouth at bedtime.   Yes [provider]  OVER THE COUNTER MEDICATION Take 1  tablet by mouth 2 (two) times daily. Celebrate-ADEK (Barriatric vitamin)   Yes [provider]     No family history on file.  Social History   Socioeconomic History  . Marital status: Married    Spouse name: Not on file  . Number of children: Not on file  . Years of education: Not on file  . Highest education level: Not on file  Occupational History  . Not on file  Tobacco Use  . Smoking status: Former Smoker    Quit date: 06/14/2011    Years since quitting: 9.1  . Smokeless tobacco: Not on file  Substance and Sexual Activity  . Alcohol use: Not on file  . Drug use: Not on file  . Sexual activity: Not on file  Other Topics Concern  . Not on file  Social History Narrative  . Not on file   Social Determinants of Health   Financial Resource Strain: Not on file  Food Insecurity: Not on file  Transportation Needs: Not on file  Physical Activity: Not on file  Stress: Not on file  Social Connections: Not on file     Review of Systems: A 12 point ROS discussed and pertinent positives are indicated in the HPI above.  All other systems are negative.  Review of Systems  Constitutional: Negative for fatigue and fever.  HENT: Negative for congestion.   Respiratory: Negative for cough and shortness of breath.   Gastrointestinal: Positive for abdominal pain ( radiating to her back). Negative for diarrhea, nausea and vomiting.    Vital  Signs: BP 115/75 (BP Location: Left Arm)   Pulse (!) 50   Temp 98.6 F (37 C) (Oral)   Resp 18   Ht 5\' 1"  (1.549 m)   Wt 145 lb (65.8 kg)   SpO2 99%   BMI 27.40 kg/m   Physical Exam Vitals and nursing note reviewed.  Constitutional:      Appearance: She is well-developed.  HENT:     Head: Normocephalic and atraumatic.  Eyes:     Conjunctiva/sclera: Conjunctivae normal.  Pulmonary:     Effort: Pulmonary effort is normal.  Musculoskeletal:        General: Normal range of motion.     Cervical back: Normal range of motion.   Skin:    General: Skin is warm.  Neurological:     Mental Status: She is alert and oriented to person, place, and time.     Imaging: CT ABDOMEN PELVIS WO CONTRAST  Result Date: 07/25/2020 CLINICAL DATA:  Acute, nonlocalized abdominal pain EXAM: CT ABDOMEN AND PELVIS WITHOUT CONTRAST TECHNIQUE: Multidetector CT imaging of the abdomen and pelvis was performed following the standard protocol without IV contrast. COMPARISON:  MR of the abdomen from 3 days ago FINDINGS: Lower chest:  Mild airway thickening at the bases Hepatobiliary: Fatty infiltration in the inferior left lobe liver.Cholecystectomy. No bile duct dilatation. Pancreas: Unremarkable. Spleen: Unremarkable. Adrenals/Urinary Tract: Negative adrenals. No hydronephrosis or stone. Unremarkable bladder. Stomach/Bowel: Gastric bypass. No obstruction or visible marginal inflammation. No abnormal stool retention. Vascular/Lymphatic: Mild but notable for age atheromatous calcification of the aorta. No mass or adenopathy. Reproductive:No pathologic findings. Other: No ascites or pneumoperitoneum. Musculoskeletal: No acute abnormalities. Lower thoracic spondylosis and lower lumbar facet osteoarthritis. L4-5 degenerative anterolisthesis. IMPRESSION: No acute finding.  No bowel obstruction or visible inflammation. Electronically Signed   By: Marnee SpringJonathon  Watts M.D.   On: 07/25/2020 04:57   CT Abdomen Pelvis Wo Contrast  Result Date: 07/21/2020 CLINICAL DATA:  Abdominal pain. Patient reports a history of stones. History of a cholecystectomy. EXAM: CT ABDOMEN AND PELVIS WITHOUT CONTRAST TECHNIQUE: Multidetector CT imaging of the abdomen and pelvis was performed following the standard protocol without IV contrast. COMPARISON:  None. FINDINGS: Lower chest: Clear lung bases.  Heart normal in size. Hepatobiliary: Liver normal in overall size attenuation. There is a focal area of hypoattenuation, 3 cm in size, inferior aspect of segment 4 B, likely an area of focal  fat. There is no mass effect from this. No other liver lesions. Status post cholecystectomy. No bile duct dilation. Pancreas: Choose 1 Spleen: Normal in size without focal abnormality. Adrenals/Urinary Tract: Adrenal glands are unremarkable. Kidneys are normal, without renal calculi, focal lesion, or hydronephrosis. Bladder is unremarkable. 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 No stomach wall thickening or inflammation. Small bowel and colon are normal in caliber. No wall thickening. No inflammatory changes. Normal appendix visualized. Vascular/Lymphatic: Mild aortic atherosclerosis. No enlarged lymph nodes. Reproductive: Uterus and bilateral adnexa are unremarkable. Other: No abdominal wall hernia or abnormality. No abdominopelvic ascites. Musculoskeletal: No fracture or acute finding.  No bone lesion. IMPRESSION: 1. No acute findings within the abdomen or pelvis. 2. Changes consistent with previous gastric bypass surgery as well as a prior cholecystectomy. 3. 3 cm low-attenuation area in the inferior aspect the liver, segment 4 B, likely due to focal fat. This could reflect the sequela from previous gallbladder surgery. Consider further assessment with liver ultrasound or, preferably, liver MRI without and with contrast.  4. Mild aortic atherosclerosis. Electronically Signed   By: Amie Portland M.D.   On: 07/21/2020 12:01   MR ANGIO CHEST WO CONTRAST  Result Date: 07/21/2020 CLINICAL DATA:  Chest and abdominal pain EXAM: MRA ABDOMEN WITHOUT CONTRAST; MRA CHEST WITHOUT CONTRAST TECHNIQUE: Angiographic images of the chest and abdomen were obtained using MRA technique without intravenous contrast. CONTRAST:  None COMPARISON:  CT of the abdomen and pelvis from earlier in the same day. FINDINGS: MRA of the chest: Aorta: Examination is somewhat limited due to cardiac and pulsatile artifact. No aneurysmal dilatation of the thoracic aorta is  noted. Segmental visualization of the aorta is seen without evidence of dissection. Heart: No cardiac enlargement is noted. Pulmonary Arteries:  Pulmonary arteries are grossly unremarkable. Other: Lungs are well aerated bilaterally. No bony abnormality is noted. Surrounding soft tissues are within normal limits. No sizable hilar or mediastinal adenopathy is noted. MRA of the abdomen: Abdominal aorta is well visualized without aneurysmal dilatation or dissection. Celiac axis and superior mesenteric artery are widely patent hepatic venous and portal venous system are within normal limits. Limited evaluation of the liver, spleen, pancreas, adrenal glands and kidneys shows no acute abnormality. Left retroaortic renal vein is noted. No acute bony abnormality is noted. IMPRESSION: Somewhat limited exam due to motion artifact and lack of IV contrast. No findings to suggest the thoracic or abdominal aortic aneurysm or dissection are seen. No acute abnormality is noted. Electronically Signed   By: Alcide Clever M.D.   On: 07/21/2020 18:55   MR ANGIO ABDOMEN WO CONTRAST  Result Date: 07/21/2020 CLINICAL DATA:  Chest and abdominal pain EXAM: MRA ABDOMEN WITHOUT CONTRAST; MRA CHEST WITHOUT CONTRAST TECHNIQUE: Angiographic images of the chest and abdomen were obtained using MRA technique without intravenous contrast. CONTRAST:  None COMPARISON:  CT of the abdomen and pelvis from earlier in the same day. FINDINGS: MRA of the chest: Aorta: Examination is somewhat limited due to cardiac and pulsatile artifact. No aneurysmal dilatation of the thoracic aorta is noted. Segmental visualization of the aorta is seen without evidence of dissection. Heart: No cardiac enlargement is noted. Pulmonary Arteries:  Pulmonary arteries are grossly unremarkable. Other: Lungs are well aerated bilaterally. No bony abnormality is noted. Surrounding soft tissues are within normal limits. No sizable hilar or mediastinal adenopathy is noted. MRA of the  abdomen: Abdominal aorta is well visualized without aneurysmal dilatation or dissection. Celiac axis and superior mesenteric artery are widely patent hepatic venous and portal venous system are within normal limits. Limited evaluation of the liver, spleen, pancreas, adrenal glands and kidneys shows no acute abnormality. Left retroaortic renal vein is noted. No acute bony abnormality is noted. IMPRESSION: Somewhat limited exam due to motion artifact and lack of IV contrast. No findings to suggest the thoracic or abdominal aortic aneurysm or dissection are seen. No acute abnormality is noted. Electronically Signed   By: Alcide Clever M.D.   On: 07/21/2020 18:55   US Abdomen Limited  Result Date: 07/24/2020 CLINICAL DATA:  Epigastric pain.  Elevated LFTs. EXAM: ULTRASOUND ABDOMEN LIMITED RIGHT UPPER QUADRANT COMPARISON:  CT scan Jul 21, 2020.  MRI Jul 22, 2020. FINDINGS: Gallbladder: Surgically absent Common bile duct: Diameter: 7.28 mm Liver: There is a 2.2 x 1.4 x 2.2 cm hyperechoic masslike region which correlates with the focal fatty deposition seen along the undersurface of the liver on the Jul 22, 2020 MRI. No other suspicious masses. Portal vein is patent on color Doppler imaging with normal direction of blood  flow towards the liver. Other: None. IMPRESSION: 1. The 2.2 x 1.4 x 2.2 cm hyperechoic masslike region along the undersurface of the liver was noted to represent focal fatty deposition on the comparison MRI. 2. Previous cholecystectomy. 3. The common bile duct is mildly dilated, consistent with previous cholecystectomy. Electronically Signed   By: Gerome Sam III M.D   On: 07/24/2020 18:16   MR ABDOMEN MRCP W WO CONTAST  Result Date: 07/22/2020 CLINICAL DATA:  Abdominal pain and elevated liver function test. Post bariatric surgery. Prior removal of lap band. Prior cholecystectomy. EXAM: MRI ABDOMEN WITHOUT AND WITH CONTRAST (INCLUDING MRCP) TECHNIQUE: Multiplanar multisequence MR imaging of the  abdomen was performed both before and after the administration of intravenous contrast. Heavily T2-weighted images of the biliary and pancreatic ducts were obtained, and three-dimensional MRCP images were rendered by post processing. CONTRAST:  6.67mL GADAVIST GADOBUTROL 1 MMOL/ML IV SOLN COMPARISON:  CT 07/21/2020 FINDINGS: Lower chest:  Lung bases are clear. Hepatobiliary: Postcholecystectomy anatomy. No significant intrahepatic duct dilatation. The common hepatic duct measures 7 mm. The common bile duct measures 5 mm (image 17/series 4). No filling defect within the common bile duct MRCP sequences are degraded by patient motion. Opposed phase imaging demonstrates no significant hepatic steatosis (series 11). There is however loss of signal intensity along the inferior margin of the liver on out of phase imaging consistent with focal fatty infiltration (image 22/series 11). Postcontrast enhanced imaging demonstrates no enhancing hepatic lesion. Pancreas: Normal pancreatic duct. No variant pancreatic ductal anatomy. No pancreatic inflammation or lesion. Spleen: Normal spleen. Adrenals/urinary tract: Adrenal glands and kidneys are normal. Stomach/Bowel: Post bariatric surgery with duodenal biliary diversion. Natalie difficult to follow on MRI due to bowel movement. Vascular/Lymphatic: Abdominal aortic normal caliber. No retroperitoneal periportal lymphadenopathy. Musculoskeletal: No aggressive osseous lesion IMPRESSION: 1. Normal biliary tree post cholecystectomy. No choledocholithiasis. 2. No pancreatic inflammation.  Normal pancreatic ductal anatomy. 3. Focal fatty infiltration along the inferior margin the RIGHT hepatic lobe. 4. Bariatric surgery anatomy difficult define on MRI. Electronically Signed   By: Genevive Bi M.D.   On: 07/22/2020 18:29    Labs:  CBC: Recent Labs    07/21/20 0927 07/22/20 0157 07/23/20 0333 07/24/20 0425  WBC 7.7 7.4 6.1 5.3  HGB 15.6* 12.6 12.1 12.1  HCT 47.7* 38.4  37.3 37.2  PLT 263 203 186 171    COAGS: No results for input(s): INR, APTT in the last 8760 hours.  BMP: Recent Labs    07/22/20 0157 07/23/20 0333 07/24/20 0425 07/25/20 0140  NA 135 136 135 138  K 4.2 4.0 4.0 3.9  CL 109 104 104 107  CO2 18* 21* 17* 22  GLUCOSE 71 60* 51* 84  BUN 11 12 9  5*  CALCIUM 8.5* 8.6* 8.4* 8.5*  CREATININE 0.64 0.73 0.75 0.63  GFRNONAA >60 >60 >60 >60    LIVER FUNCTION TESTS: Recent Labs    07/22/20 0157 07/23/20 0333 07/24/20 0425 07/25/20 0140  BILITOT 1.4* 0.9 1.2 0.9  AST 301* 227* 64* 47*  ALT 173* 201* 128* 97*  ALKPHOS 150* 192* 170* 155*  PROT 5.3* 5.3* 5.1* 5.2*  ALBUMIN 3.2* 3.2* 3.1* 3.1*   Assessment and Plan:  39 y.o. female inpatient. History of knee arthritis, OSA ,Morbid obesity s/p bariatric surgery biliopancreatic diversion with duodenal switch s/p repair of duodenoileostomy leak 55 patch) and cholecystectomy 9.19 at OSH. Presented to the ED at [facility] on 5.9.22 with abdominal  pain found to have elevated LFT's.  MRCP  perfomed on 5.10.22 shows a normal biliary tree with  no dilation of the biliary tree or inflammation. CT abd from 5.13.21 shows no acute findings. LFT's downtrending.  AST is now 47  (was 64, 227) and ALT is now 97 (was 128, 201). Alkaline phosphatase 155 ( was 170, 192) Total protein 5.2 (was 5.1, 5.3). Total bilirubin is 0.9. Lipase from 5.10.22 - 30. Team is requesting evaluation for possible internal external biliary drain placement. Case discussed between IR Attending (Dr.J. Watts) and GI (Dr. Lamont Snowball). Patient seen at bedside with husband. Indication for biliary drain placement and post procedure education provided. Patient and husband state that they do not want  To proceed with biliary drain placement at this time. Patient states she would prefer to be transferred to [facility] for an ERCP. Should patient condition change please notify IR. Patient to stable from IR perspective further plans per Primary Team and GI/  Please call IR with questions or concerns.  Patient and husband verbalized understanding and are in agreement with the plan of care.   Thank you for this interesting consult.  I greatly enjoyed meeting Solene Hereford and look forward to participating in their care.  A copy of this report was sent to the requesting provider on this date.  Electronically Signed: Alene Mires, NP 07/25/2020, 8:44 AM   I spent a total of 20 Minutes    in face to face in clinical consultation, greater than 50% of which was counseling/coordinating care for evaluation for biliary drain placement

## 2020-07-25 NOTE — Progress Notes (Addendum)
Progress Note   Subjective  Abrupt worsening of epigastric pain radiating to the back overnight   Objective  Vital signs in last 24 hours: Temp:  [98.3 F (36.8 C)-98.7 F (37.1 C)] 98.6 F (37 C) (05/13 0507) Pulse Rate:  [41-62] 50 (05/13 0507) Resp:  [16-20] 18 (05/13 0507) BP: (100-133)/(56-76) 115/75 (05/13 0507) SpO2:  [99 %-100 %] 99 % (05/13 0507) Last BM Date: 07/22/20  General: Alert, well-developed, uncomfortable Heart:  Regular rate and rhythm; no murmurs Chest: Clear to ascultation bilaterally Abdomen:  Soft, epigastric tenderness and nondistended. Normal bowel sounds, without guarding, and without rebound.   Extremities:  Without edema. Neurologic:  Alert and  oriented x4; grossly normal neurologically. Psych:  Alert and cooperative. Anxious.  Intake/Output from previous day: 05/12 0701 - 05/13 0700 In: 220 [P.O.:220] Out: -  Intake/Output this shift: No intake/output data recorded.  Lab Results: Recent Labs    07/23/20 0333 07/24/20 0425  WBC 6.1 5.3  HGB 12.1 12.1  HCT 37.3 37.2  PLT 186 171   BMET Recent Labs    07/23/20 0333 07/24/20 0425 07/25/20 0140  NA 136 135 138  K 4.0 4.0 3.9  CL 104 104 107  CO2 21* 17* 22  GLUCOSE 60* 51* 84  BUN 12 9 5*  CREATININE 0.73 0.75 0.63  CALCIUM 8.6* 8.4* 8.5*   LFT Recent Labs    07/25/20 0140  PROT 5.2*  ALBUMIN 3.1*  AST 47*  ALT 97*  ALKPHOS 155*  BILITOT 0.9   PT/INR No results for input(s): LABPROT, INR in the last 72 hours. Hepatitis Panel Recent Labs    07/23/20 1109  HEPBSAG NON REACTIVE  HCVAB NON REACTIVE  HEPAIGM NON REACTIVE  HEPBIGM NON REACTIVE    Studies/Results: CT ABDOMEN PELVIS WO CONTRAST  Result Date: 07/25/2020 CLINICAL DATA:  Acute, nonlocalized abdominal pain EXAM: CT ABDOMEN AND PELVIS WITHOUT CONTRAST TECHNIQUE: Multidetector CT imaging of the abdomen and pelvis was performed following the standard protocol without IV contrast. COMPARISON:  MR of  the abdomen from 3 days ago FINDINGS: Lower chest:  Mild airway thickening at the bases Hepatobiliary: Fatty infiltration in the inferior left lobe liver.Cholecystectomy. No bile duct dilatation. Pancreas: Unremarkable. Spleen: Unremarkable. Adrenals/Urinary Tract: Negative adrenals. No hydronephrosis or stone. Unremarkable bladder. Stomach/Bowel: Gastric bypass. No obstruction or visible marginal inflammation. No abnormal stool retention. Vascular/Lymphatic: Mild but notable for age atheromatous calcification of the aorta. No mass or adenopathy. Reproductive:No pathologic findings. Other: No ascites or pneumoperitoneum. Musculoskeletal: No acute abnormalities. Lower thoracic spondylosis and lower lumbar facet osteoarthritis. L4-5 degenerative anterolisthesis. IMPRESSION: No acute finding.  No bowel obstruction or visible inflammation. Electronically Signed   By: Marnee Spring M.D.   On: 07/25/2020 04:57   US Abdomen Limited  Result Date: 07/24/2020 CLINICAL DATA:  Epigastric pain.  Elevated LFTs. EXAM: ULTRASOUND ABDOMEN LIMITED RIGHT UPPER QUADRANT COMPARISON:  CT scan Jul 21, 2020.  MRI Jul 22, 2020. FINDINGS: Gallbladder: Surgically absent Common bile duct: Diameter: 7.28 mm Liver: There is a 2.2 x 1.4 x 2.2 cm hyperechoic masslike region which correlates with the focal fatty deposition seen along the undersurface of the liver on the Jul 22, 2020 MRI. No other suspicious masses. Portal vein is patent on color Doppler imaging with normal direction of blood flow towards the liver. Other: None. IMPRESSION: 1. The 2.2 x 1.4 x 2.2 cm hyperechoic masslike region along the undersurface of the liver was noted to represent focal fatty deposition on the comparison  MRI. 2. Previous cholecystectomy. 3. The common bile duct is mildly dilated, consistent with previous cholecystectomy. Electronically Signed   By: Dorise Bullion III M.D   On: 07/24/2020 18:16      Assessment & Recommendations   1. Epigastric pain  with acute elevation of LFTs. Her pain was improving and it abruptly worsened overnight. CT AP overnight shows prior bypass, prior cholecystectomy, no biliary dilation, fatty infiltrate in left lobe of the liver, no acute findings. LFTs have been steadily improving. RUQ [place] yesterday showed focal fat in liver correlating with MR and CT and CBD=7.28 mm.  Clinical suspicion remains high for small CBD stone(s) or sphincter of Oddi dysfunction (SOD).  Repeat LFTs and check lipase this morning. I discussed her care with Dr. Damita Lack, IR, yesterday and Dr. Pascal Lux, IR, today. IR will see in consult today for evaluation and consideration of PTC. Continue to attempt inpatient transfer to [facility] for GI evaluation over the next few days or if she improves  discharge and coordiate outpatient [facility] GI evaluation with Dr. Gayleen Orem next week.  Drs. Collene Mares and Benson Norway are covering this weekend and will follow up tomorrow.     LOS: 3 days   Norberto Sorenson T. Fuller Plan MD 07/25/2020, 8:47 AM [phone]

## 2020-07-25 NOTE — Progress Notes (Signed)
PROGRESS NOTE  Monique Floyd JAS:[id number] DOB: 1980/10/14 DOA: 07/21/2020 PCP: Gatha Mayer, MD   LOS: 3 days   Brief narrative:  Monique Floyd is a 40 y.o. female with medical history significant for previous knees arthritis, OSA with CPAP, morbid obesity status post bariatric surgery, laparoscopic pancreatic diversion, laparoscopic removal of lap band, laparoscopy cholecystectomy performed in 2019, laparoscopic repair and Phillip Heal patch, diagnostic laparoscopy and washout of the abdomen, EGD performed in 2019 at [facility] presented to hospital with sudden onset of severe epigastric pain radiating over the upper quadrants without any nausea constipation or diarrhea.  CT scan of the abdomen and pelvis without any acute findings.  MRI angiogram of the chest abdomen with negative as well.  Despite this she persisted to have abdominal pain and was admitted to hospital.  Lab work was normal as well.  GI was consulted and patient was admitted hospital for further evaluation and treatment.  Assessment/Plan:  Active Problems:   Abdominal pain   Elevated LFTs   Abdominal pain, epigastric  Severe epigastric pain/elevated LFTs, unclear etiology GI on board.  CT scan MRI negative. MRCP also unremarkable.  History of bariatric surgery laparoscopic pancreatic diabetes and removal of lap band and laparoscopic cholecystectomy in the past.  Her pain is worsened.  Some of the LFTs have improved but others have worsened.  GI recommends ERCP however due to her history of gastric bypass surgery and multiple other surgeries, GI here is unable to perform ERCP and has recommended transferring to tertiary care center.  Since patient has had her surgeries done at [facility], I called them and I was informed that they do not have any beds to accept this patient.  Patient's LFTs are improving however she claims that her pain has not.  She also had worsening prompted repeat CT abdomen which was negative.  Per GI,  Clinical suspicion remains high for small CBD stone(s) or sphincter of Oddi dysfunction (SOD).  Dr. Fuller Plan discussed her care with Dr. Damita Lack, IR, yesterday and Dr. Pascal Lux, IR, today. IR will see in consult today for evaluation and consideration of PTC.  Presumptive UTI, POA Urinalysis positive for pyuria.  Continue Keflex for total of 5 days.  Continue IV fluids   History of morbid obesity post bariatric surgery Current BMI 27. Patient follows up with surgeons at [facility]  DVT prophylaxis: SCDs Start: 07/21/20 1946   Code Status: Full code  Family Communication:  None  Status is: Inpatient  Remains inpatient appropriate because:IV treatments appropriate due to intensity of illness or inability to take PO and Inpatient level of care appropriate due to severity of illness   Dispo:  Patient From: Home  Planned Disposition: Home  Medically stable for discharge: No     Consultants:  GI  Procedures:  None so far  Anti-infectives: . Keflex  Anti-infectives (From admission, onward)   Start     Dose/Rate Route Frequency Ordered Stop   07/21/20 2030  cephALEXin (KEFLEX) capsule 500 mg        500 mg Oral Every 8 hours 07/21/20 1958 07/26/20 2159      Subjective: Patient seen and examined with her husband at the bedside.  She still complains of 10 out of 10 pain.  No better than yesterday however better than last night.  Objective: Vitals:   07/25/20 0147 07/25/20 0507  BP: (!) 115/56 115/75  Pulse: (!) 41 (!) 50  Resp: 20 18  Temp: 98.5 F (36.9 C) 98.6 F (37 C)  SpO2: 99% 99%    Intake/Output Summary (Last 24 hours) at 07/25/2020 1357 Last data filed at 07/24/2020 1857 Gross per 24 hour  Intake 220 ml  Output --  Net 220 ml   Filed Weights   07/21/20 0857  Weight: 65.8 kg   Body mass index is 27.4 kg/m.   Physical Exam: General exam: Appears calm and comfortable  Respiratory system: Clear to auscultation. Respiratory effort normal. Cardiovascular system: S1  & S2 heard, RRR. No JVD, murmurs, rubs, gallops or clicks. No pedal edema. Gastrointestinal system: Abdomen is nondistended, soft and epigastric and right upper quadrant tenderness. No organomegaly or masses felt. Normal bowel sounds heard.  Based on the exam, psychosomatic component seems to be big portion of her tenderness and pain. Central nervous system: Alert and oriented. No focal neurological deficits. Extremities: Symmetric 5 x 5 power. Skin: No rashes, lesions or ulcers.  Psychiatry: Judgement and insight appear normal. Mood & affect appropriate.   Data Review: I have personally reviewed the following laboratory data and studies,  CBC: Recent Labs  Lab 07/21/20 0927 07/22/20 0157 07/23/20 0333 07/24/20 0425  WBC 7.7 7.4 6.1 5.3  NEUTROABS 4.5  --   --   --   HGB 15.6* 12.6 12.1 12.1  HCT 47.7* 38.4 37.3 37.2  MCV 99.4 100.3* 99.7 99.7  PLT 263 203 186 270   Basic Metabolic Panel: Recent Labs  Lab 07/21/20 0927 07/22/20 0157 07/23/20 0333 07/24/20 0425 07/25/20 0140  NA 140 135 136 135 138  K 4.3 4.2 4.0 4.0 3.9  CL 109 109 104 104 107  CO2 22 18* 21* 17* 22  GLUCOSE 89 71 60* 51* 84  BUN _0 5*  CREATININE 0.64 0.64 0.73 0.75 0.63  CALCIUM 9.0 8.5* 8.6* 8.4* 8.5*  MG  --  1.9  --   --   --   PHOS  --  5.1*  --   --   --    Liver Function Tests: Recent Labs  Lab 07/21/20 0927 07/22/20 0157 07/23/20 0333 07/24/20 0425 07/25/20 0140  AST 39 301* 227* 64* 47*  ALT 34 173* 201* 128* 97*  ALKPHOS 77 150* 192* 170* 155*  BILITOT 1.3* 1.4* 0.9 1.2 0.9  PROT 6.7 5.3* 5.3* 5.1* 5.2*  ALBUMIN 4.2 3.2* 3.2* 3.1* 3.1*   Recent Labs  Lab 07/21/20 0927 07/22/20 0157  LIPASE 32 30   No results for input(s): AMMONIA in the last 168 hours. Cardiac Enzymes: No results for input(s): CKTOTAL, CKMB, CKMBINDEX, TROPONINI in the last 168 hours. BNP (last 3 results) No results for input(s): BNP in the last 8760 hours.  ProBNP (last 3 results) No results for  input(s): PROBNP in the last 8760 hours.  CBG: No results for input(s): GLUCAP in the last 168 hours. Recent Results (from the past 240 hour(s))  Urine culture     Status: Abnormal   Collection Time: 07/21/20 10:50 AM   Specimen: Urine, Random  Result Value Ref Range Status   Specimen Description URINE, RANDOM  Final   Special Requests   Final    NONE Performed at [facility], [address], [place], [place] [address]    Culture MULTIPLE SPECIES PRESENT, SUGGEST RECOLLECTION (A)  Final   Report Status 07/22/2020 FINAL  Final  Resp Panel by RT-PCR (Flu A&B, Covid) Nasopharyngeal Swab     Status: None   Collection Time: 07/21/20  1:15 PM   Specimen: Nasopharyngeal Swab; Nasopharyngeal(NP) swabs  in vial transport medium  Result Value Ref Range Status   SARS Coronavirus 2 by RT PCR NEGATIVE NEGATIVE Final    Comment: (NOTE) SARS-CoV-2 target nucleic acids are NOT DETECTED.  The SARS-CoV-2 RNA is generally detectable in upper respiratory specimens during the acute phase of infection. The lowest concentration of SARS-CoV-2 viral copies this assay can detect is 138 copies/mL. A negative result does not preclude SARS-Cov-2 infection and should not be used as the sole basis for treatment or other patient management decisions. A negative result may occur with  improper specimen collection/handling, submission of specimen other than nasopharyngeal swab, presence of viral mutation(s) within the areas targeted by this assay, and inadequate number of viral copies(<138 copies/mL). A negative result must be combined with clinical observations, patient history, and epidemiological information. The expected result is Negative.  Fact Sheet for Patients:  EntrepreneurPulse.com.au  Fact Sheet for Healthcare Providers:  IncredibleEmployment.be  This test is no t yet approved or cleared by the [place] FDA and  has been authorized for detection  and/or diagnosis of SARS-CoV-2 by FDA under an Emergency Use Authorization (EUA). This EUA will remain  in effect (meaning this test can be used) for the duration of the COVID-19 declaration under Section 564(b)(1) of the Act, 21 U.S.C.section 360bbb-3(b)(1), unless the authorization is terminated  or revoked sooner.       Influenza A by PCR NEGATIVE NEGATIVE Final   Influenza B by PCR NEGATIVE NEGATIVE Final    Comment: (NOTE) The Xpert Xpress SARS-CoV-2/FLU/RSV plus assay is intended as an aid in the diagnosis of influenza from Nasopharyngeal swab specimens and should not be used as a sole basis for treatment. Nasal washings and aspirates are unacceptable for Xpert Xpress SARS-CoV-2/FLU/RSV testing.  Fact Sheet for Patients: EntrepreneurPulse.com.au  Fact Sheet for Healthcare Providers: IncredibleEmployment.be  This test is not yet approved or cleared by the [place] FDA and has been authorized for detection and/or diagnosis of SARS-CoV-2 by FDA under an Emergency Use Authorization (EUA). This EUA will remain in effect (meaning this test can be used) for the duration of the COVID-19 declaration under Section 564(b)(1) of the Act, 21 U.S.C. section 360bbb-3(b)(1), unless the authorization is terminated or revoked.  Performed at [facility], [place] [address], [place], [place] [address]      Studies: CT ABDOMEN PELVIS WO CONTRAST  Result Date: 07/25/2020 CLINICAL DATA:  Acute, nonlocalized abdominal pain EXAM: CT ABDOMEN AND PELVIS WITHOUT CONTRAST TECHNIQUE: Multidetector CT imaging of the abdomen and pelvis was performed following the standard protocol without IV contrast. COMPARISON:  MR of the abdomen from 3 days ago FINDINGS: Lower chest:  Mild airway thickening at the bases Hepatobiliary: Fatty infiltration in the inferior left lobe liver.Cholecystectomy. No bile duct dilatation. Pancreas: Unremarkable. Spleen: Unremarkable.  Adrenals/Urinary Tract: Negative adrenals. No hydronephrosis or stone. Unremarkable bladder. Stomach/Bowel: Gastric bypass. No obstruction or visible marginal inflammation. No abnormal stool retention. Vascular/Lymphatic: Mild but notable for age atheromatous calcification of the aorta. No mass or adenopathy. Reproductive:No pathologic findings. Other: No ascites or pneumoperitoneum. Musculoskeletal: No acute abnormalities. Lower thoracic spondylosis and lower lumbar facet osteoarthritis. L4-5 degenerative anterolisthesis. IMPRESSION: No acute finding.  No bowel obstruction or visible inflammation. Electronically Signed   By: Monte Fantasia M.D.   On: 07/25/2020 04:57   US Abdomen Limited  Result Date: 07/24/2020 CLINICAL DATA:  Epigastric pain.  Elevated LFTs. EXAM: ULTRASOUND ABDOMEN LIMITED RIGHT UPPER QUADRANT COMPARISON:  CT scan Jul 21, 2020.  MRI Jul 22, 2020. FINDINGS: Gallbladder: Surgically absent Common bile duct: Diameter: 7.28 mm Liver: There is a 2.2 x 1.4 x 2.2 cm hyperechoic masslike region which correlates with the focal fatty deposition seen along the undersurface of the liver on the Jul 22, 2020 MRI. No other suspicious masses. Portal vein is patent on color Doppler imaging with normal direction of blood flow towards the liver. Other: None. IMPRESSION: 1. The 2.2 x 1.4 x 2.2 cm hyperechoic masslike region along the undersurface of the liver was noted to represent focal fatty deposition on the comparison MRI. 2. Previous cholecystectomy. 3. The common bile duct is mildly dilated, consistent with previous cholecystectomy. Electronically Signed   By: Dorise Bullion III M.D   On: 07/24/2020 18:16    Darliss Cheney, MD  [facility] 07/25/2020  If 7PM-7AM, please contact night-coverage

## 2020-07-26 ENCOUNTER — Encounter (HOSPITAL_COMMUNITY): Payer: Self-pay | Admitting: Internal Medicine

## 2020-07-26 LAB — COMPREHENSIVE METABOLIC PANEL
ALT: 75 U/L — ABNORMAL HIGH (ref 0–44)
AST: 36 U/L (ref 15–41)
Albumin: 3 g/dL — ABNORMAL LOW (ref 3.5–5.0)
Alkaline Phosphatase: 131 U/L — ABNORMAL HIGH (ref 38–126)
Anion gap: 8 (ref 5–15)
BUN: <5 mg/dL — ABNORMAL LOW (ref 6–20)
CO2: 24 mmol/L (ref 22–32)
Calcium: 8.5 mg/dL — ABNORMAL LOW (ref 8.9–10.3)
Chloride: 104 mmol/L (ref 98–111)
Creatinine, Ser: 0.54 mg/dL (ref 0.44–1.00)
GFR, Estimated: >60 mL/min (ref >60)
Glucose, Bld: 87 mg/dL (ref 70–99)
Potassium: 3.6 mmol/L (ref 3.5–5.1)
Sodium: 136 mmol/L (ref 135–145)
Total Bilirubin: 0.8 mg/dL (ref 0.3–1.2)
Total Protein: 4.9 g/dL — ABNORMAL LOW (ref 6.5–8.1)

## 2020-07-26 MED ORDER — ENOXAPARIN SODIUM 40 MG/0.4ML IJ SOSY
40.0000 mg | PREFILLED_SYRINGE | INTRAMUSCULAR | Status: DC
  Administered 2020-07-26 – 2020-07-29 (×4): 40 mg via SUBCUTANEOUS
  Filled 2020-07-26 (×5): qty 0.4

## 2020-07-26 NOTE — Progress Notes (Signed)
PROGRESS NOTE  Monique Floyd CBS:[id number] DOB: [birth year] DOA: 07/21/2020 PCP: Gatha Mayer, MD   LOS: 4 days   Brief narrative:  Monique Floyd is a 40 y.o. female with medical history significant for previous knees arthritis, OSA with CPAP, morbid obesity status post bariatric surgery, laparoscopic pancreatic diversion, laparoscopic removal of lap band, laparoscopy cholecystectomy performed in 2019, laparoscopic repair and Phillip Heal patch, diagnostic laparoscopy and washout of the abdomen, EGD performed in 2019 at [facility] presented to hospital with sudden onset of severe epigastric pain radiating over the upper quadrants without any nausea constipation or diarrhea.  CT scan of the abdomen and pelvis without any acute findings.  MRI angiogram of the chest abdomen with negative as well.  Despite this she persisted to have abdominal pain and was admitted to hospital.  Lab work was normal as well.  GI was consulted and patient was admitted hospital for further evaluation and treatment.  Assessment/Plan:  Active Problems:   Abdominal pain   Elevated LFTs   Abdominal pain, epigastric  Severe epigastric pain/elevated LFTs, unclear etiology GI on board.  CT scan MRI negative. MRCP also unremarkable.  History of bariatric surgery laparoscopic pancreatic diabetes and removal of lap band and laparoscopic cholecystectomy in the past.  Her pain is worsened.  Some of the LFTs have improved but others have worsened.  GI recommends ERCP however due to her history of gastric bypass surgery and multiple other surgeries, GI here is unable to perform ERCP and has recommended transferring to tertiary care center.  Since patient has had her surgeries done at [facility], I called them and I was informed that they do not have any beds to accept this patient.  She also had worsening prompted repeat CT abdomen which was negative.  Per GI, Clinical suspicion remains high for small CBD stone(s) or sphincter of Oddi  dysfunction (SOD).  Dr. Fuller Plan discussed her care with Dr. Damita Lack, IR and Dr. Pascal Lux, IR,.   IR discussed with the patient about pros and cons of PTC however patient declined that.  Patient's LFTs are improving but she claims that her pain is not improving at all.  Based on the examination, patient's pain and tenderness seems to be more of a psychosomatic than organic.  She did not flinch much with abdominal palpation when her mind was diverted.  I once again called [facility] today and I was able to talk to hospitalist named Dr. Jonathon Bellows.  I was informed that patient was never placed on waiting list and they do not place anyone on waiting list and they ask to call every morning.  I did once again reiterated that patient needs higher level of care and ERCP according to our GI consultants and that our campus is not able to provide the services she needs and she needs to be transferred and have ERCP at [facility].  I was told by the hospitalist that he will discussed the case with GI and will get back to me.  It has been several hours and I have not received a phone call back.  Presumptive UTI, POA Urinalysis positive for pyuria.  Continue Keflex for total of 5 days.  Continue IV fluids   History of morbid obesity post bariatric surgery Current BMI 27. Patient follows up with surgeons at [facility]  DVT prophylaxis: SCDs Start: 07/21/20 1946   Code Status: Full code  Family Communication:  None  Status is: Inpatient  Remains inpatient appropriate because:IV treatments appropriate due to intensity of illness  or inability to take PO and Inpatient level of care appropriate due to severity of illness   Dispo:  Patient From: Home  Planned Disposition: Home  Medically stable for discharge: No     Consultants:  GI  Procedures:  None so far  Anti-infectives: . Keflex  Anti-infectives (From admission, onward)   Start     Dose/Rate Route Frequency Ordered Stop   07/21/20 2030  cephALEXin (KEFLEX) capsule 500 mg         500 mg Oral Every 8 hours 07/21/20 1958 07/26/20 2159      Subjective: Seen and examined.  She once again states that she is not feeling any better than yesterday or day before.  Continues to have same degree of pain.  Objective: Vitals:   07/26/20 0452 07/26/20 1330  BP: 119/63 114/76  Pulse: (!) 55 60  Resp: 16 17  Temp: 98 F (36.7 C) 98 F (36.7 C)  SpO2: 95% 99%    Intake/Output Summary (Last 24 hours) at 07/26/2020 1420 Last data filed at 07/26/2020 0830 Gross per 24 hour  Intake 960 ml  Output --  Net 960 ml   Filed Weights   07/21/20 0857  Weight: 65.8 kg   Body mass index is 27.4 kg/m.   Physical Exam: General exam: Appears calm and comfortable  Respiratory system: Clear to auscultation. Respiratory effort normal. Cardiovascular system: S1 & S2 heard, RRR. No JVD, murmurs, rubs, gallops or clicks. No pedal edema. Gastrointestinal system: Abdomen is nondistended, soft and marked epigastric tenderness (psychosomatic). No organomegaly or masses felt. Normal bowel sounds heard. Central nervous system: Alert and oriented. No focal neurological deficits. Extremities: Symmetric 5 x 5 power. Skin: No rashes, lesions or ulcers.  Psychiatry: Judgement and insight appear normal. Mood & affect appropriate.    Data Review: I have personally reviewed the following laboratory data and studies,  CBC: Recent Labs  Lab 07/21/20 0927 07/22/20 0157 07/23/20 0333 07/24/20 0425  WBC 7.7 7.4 6.1 5.3  NEUTROABS 4.5  --   --   --   HGB 15.6* 12.6 12.1 12.1  HCT 47.7* 38.4 37.3 37.2  MCV 99.4 100.3* 99.7 99.7  PLT 263 203 186 725   Basic Metabolic Panel: Recent Labs  Lab 07/22/20 0157 07/23/20 0333 07/24/20 0425 07/25/20 0140 07/26/20 0118  NA 135 136 135 138 136  K 4.2 4.0 4.0 3.9 3.6  CL 109 104 104 107 104  CO2 18* 21* 17* 22 24  GLUCOSE 71 60* 51* 84 87  BUN $Re'11 12 9 'GWR$ 5* <5*  CREATININE 0.64 0.73 0.75 0.63 0.54  CALCIUM 8.5* 8.6* 8.4* 8.5* 8.5*  MG 1.9   --   --   --   --   PHOS 5.1*  --   --   --   --    Liver Function Tests: Recent Labs  Lab 07/22/20 0157 07/23/20 0333 07/24/20 0425 07/25/20 0140 07/26/20 0118  AST 301* 227* 64* 48*  47* 36  ALT 173* 201* 128* 97*  97* 75*  ALKPHOS 150* 192* 170* 142*  155* 131*  BILITOT 1.4* 0.9 1.2 0.8  0.9 0.8  PROT 5.3* 5.3* 5.1* 5.3*  5.2* 4.9*  ALBUMIN 3.2* 3.2* 3.1* 3.2*  3.1* 3.0*   Recent Labs  Lab 07/21/20 0927 07/22/20 0157 07/25/20 0140  LIPASE 32 30 26   No results for input(s): AMMONIA in the last 168 hours. Cardiac Enzymes: No results for input(s): CKTOTAL, CKMB, CKMBINDEX, TROPONINI in the last 168 hours.  BNP (last 3 results) No results for input(s): BNP in the last 8760 hours.  ProBNP (last 3 results) No results for input(s): PROBNP in the last 8760 hours.  CBG: No results for input(s): GLUCAP in the last 168 hours. Recent Results (from the past 240 hour(s))  Urine culture     Status: Abnormal   Collection Time: 07/21/20 10:50 AM   Specimen: Urine, Random  Result Value Ref Range Status   Specimen Description URINE, RANDOM  Final   Special Requests   Final    NONE Performed at [facility], [address] [place], [place] [address]    Culture MULTIPLE SPECIES PRESENT, SUGGEST RECOLLECTION (A)  Final   Report Status 07/22/2020 FINAL  Final  Resp Panel by RT-PCR (Flu A&B, Covid) Nasopharyngeal Swab     Status: None   Collection Time: 07/21/20  1:15 PM   Specimen: Nasopharyngeal Swab; Nasopharyngeal(NP) swabs in vial transport medium  Result Value Ref Range Status   SARS Coronavirus 2 by RT PCR NEGATIVE NEGATIVE Final    Comment: (NOTE) SARS-CoV-2 target nucleic acids are NOT DETECTED.  The SARS-CoV-2 RNA is generally detectable in upper respiratory specimens during the acute phase of infection. The lowest concentration of SARS-CoV-2 viral copies this assay can detect is 138 copies/mL. A negative result does not preclude SARS-Cov-2 infection and  should not be used as the sole basis for treatment or other patient management decisions. A negative result may occur with  improper specimen collection/handling, submission of specimen other than nasopharyngeal swab, presence of viral mutation(s) within the areas targeted by this assay, and inadequate number of viral copies(<138 copies/mL). A negative result must be combined with clinical observations, patient history, and epidemiological information. The expected result is Negative.  Fact Sheet for Patients:  EntrepreneurPulse.com.au  Fact Sheet for Healthcare Providers:  IncredibleEmployment.be  This test is no t yet approved or cleared by the [place] FDA and  has been authorized for detection and/or diagnosis of SARS-CoV-2 by FDA under an Emergency Use Authorization (EUA). This EUA will remain  in effect (meaning this test can be used) for the duration of the COVID-19 declaration under Section 564(b)(1) of the Act, 21 U.S.C.section 360bbb-3(b)(1), unless the authorization is terminated  or revoked sooner.       Influenza A by PCR NEGATIVE NEGATIVE Final   Influenza B by PCR NEGATIVE NEGATIVE Final    Comment: (NOTE) The Xpert Xpress SARS-CoV-2/FLU/RSV plus assay is intended as an aid in the diagnosis of influenza from Nasopharyngeal swab specimens and should not be used as a sole basis for treatment. Nasal washings and aspirates are unacceptable for Xpert Xpress SARS-CoV-2/FLU/RSV testing.  Fact Sheet for Patients: EntrepreneurPulse.com.au  Fact Sheet for Healthcare Providers: IncredibleEmployment.be  This test is not yet approved or cleared by the [place] FDA and has been authorized for detection and/or diagnosis of SARS-CoV-2 by FDA under an Emergency Use Authorization (EUA). This EUA will remain in effect (meaning this test can be used) for the duration of the COVID-19 declaration  under Section 564(b)(1) of the Act, 21 U.S.C. section 360bbb-3(b)(1), unless the authorization is terminated or revoked.  Performed at [facility], [place] [address], [place], [place] [address]      Studies: CT ABDOMEN PELVIS WO CONTRAST  Result Date: 07/25/2020 CLINICAL DATA:  Acute, nonlocalized abdominal pain EXAM: CT ABDOMEN AND PELVIS WITHOUT CONTRAST TECHNIQUE: Multidetector CT imaging of the abdomen and pelvis was performed following the standard protocol without IV contrast.  COMPARISON:  MR of the abdomen from 3 days ago FINDINGS: Lower chest:  Mild airway thickening at the bases Hepatobiliary: Fatty infiltration in the inferior left lobe liver.Cholecystectomy. No bile duct dilatation. Pancreas: Unremarkable. Spleen: Unremarkable. Adrenals/Urinary Tract: Negative adrenals. No hydronephrosis or stone. Unremarkable bladder. Stomach/Bowel: Gastric bypass. No obstruction or visible marginal inflammation. No abnormal stool retention. Vascular/Lymphatic: Mild but notable for age atheromatous calcification of the aorta. No mass or adenopathy. Reproductive:No pathologic findings. Other: No ascites or pneumoperitoneum. Musculoskeletal: No acute abnormalities. Lower thoracic spondylosis and lower lumbar facet osteoarthritis. L4-5 degenerative anterolisthesis. IMPRESSION: No acute finding.  No bowel obstruction or visible inflammation. Electronically Signed   By: Monte Fantasia M.D.   On: 07/25/2020 04:57   US Abdomen Limited  Result Date: 07/24/2020 CLINICAL DATA:  Epigastric pain.  Elevated LFTs. EXAM: ULTRASOUND ABDOMEN LIMITED RIGHT UPPER QUADRANT COMPARISON:  CT scan Jul 21, 2020.  MRI Jul 22, 2020. FINDINGS: Gallbladder: Surgically absent Common bile duct: Diameter: 7.28 mm Liver: There is a 2.2 x 1.4 x 2.2 cm hyperechoic masslike region which correlates with the focal fatty deposition seen along the undersurface of the liver on the Jul 22, 2020 MRI. No other suspicious masses. Portal vein is  patent on color Doppler imaging with normal direction of blood flow towards the liver. Other: None. IMPRESSION: 1. The 2.2 x 1.4 x 2.2 cm hyperechoic masslike region along the undersurface of the liver was noted to represent focal fatty deposition on the comparison MRI. 2. Previous cholecystectomy. 3. The common bile duct is mildly dilated, consistent with previous cholecystectomy. Electronically Signed   By: Dorise Bullion III M.D   On: 07/24/2020 18:16    Darliss Cheney, MD  [facility] 07/26/2020  If 7PM-7AM, please contact night-coverage

## 2020-07-26 NOTE — Progress Notes (Signed)
Progress Note for [facility]  Subjective: No change with her abdominal pain.  Objective: Vital signs in last 24 hours: Temp:  [98 F (36.7 C)-98.2 F (36.8 C)] 98 F (36.7 C) (05/14 0452) Pulse Rate:  [51-55] 55 (05/14 0452) Resp:  [16-18] 16 (05/14 0452) BP: (119-145)/(63-77) 119/63 (05/14 0452) SpO2:  [95 %-100 %] 95 % (05/14 0452) Last BM Date: 07/22/20  Intake/Output from previous day: 05/13 0701 - 05/14 0700 In: 600 [P.O.:600] Out: -  Intake/Output this shift: No intake/output data recorded.  General appearance: alert and uncomfortable. GI: tender to minimal palpation, diffusely  Lab Results: Recent Labs    07/24/20 0425  WBC 5.3  HGB 12.1  HCT 37.2  PLT 171   BMET Recent Labs    07/24/20 0425 07/25/20 0140 07/26/20 0118  NA 135 138 136  K 4.0 3.9 3.6  CL 104 107 104  CO2 17* 22 24  GLUCOSE 51* 84 87  BUN 9 5* <5*  CREATININE 0.75 0.63 0.54  CALCIUM 8.4* 8.5* 8.5*   LFT Recent Labs    07/25/20 0140 07/26/20 0118  PROT 5.3*  5.2* 4.9*  ALBUMIN 3.2*  3.1* 3.0*  AST 48*  47* 36  ALT 97*  97* 75*  ALKPHOS 142*  155* 131*  BILITOT 0.8  0.9 0.8  BILIDIR 0.1  --   IBILI 0.7  --    PT/INR No results for input(s): LABPROT, INR in the last 72 hours. Hepatitis Panel Recent Labs    07/23/20 1109  HEPBSAG NON REACTIVE  HCVAB NON REACTIVE  HEPAIGM NON REACTIVE  HEPBIGM NON REACTIVE   C-Diff No results for input(s): CDIFFTOX in the last 72 hours. Fecal Lactopherrin No results for input(s): FECLLACTOFRN in the last 72 hours.  Studies/Results: CT ABDOMEN PELVIS WO CONTRAST  Result Date: 07/25/2020 CLINICAL DATA:  Acute, nonlocalized abdominal pain EXAM: CT ABDOMEN AND PELVIS WITHOUT CONTRAST TECHNIQUE: Multidetector CT imaging of the abdomen and pelvis was performed following the standard protocol without IV contrast. COMPARISON:  MR of the abdomen from 3 days ago FINDINGS: Lower chest:  Mild airway thickening at the bases Hepatobiliary:  Fatty infiltration in the inferior left lobe liver.Cholecystectomy. No bile duct dilatation. Pancreas: Unremarkable. Spleen: Unremarkable. Adrenals/Urinary Tract: Negative adrenals. No hydronephrosis or stone. Unremarkable bladder. Stomach/Bowel: Gastric bypass. No obstruction or visible marginal inflammation. No abnormal stool retention. Vascular/Lymphatic: Mild but notable for age atheromatous calcification of the aorta. No mass or adenopathy. Reproductive:No pathologic findings. Other: No ascites or pneumoperitoneum. Musculoskeletal: No acute abnormalities. Lower thoracic spondylosis and lower lumbar facet osteoarthritis. L4-5 degenerative anterolisthesis. IMPRESSION: No acute finding.  No bowel obstruction or visible inflammation. Electronically Signed   By: Marnee Spring M.D.   On: 07/25/2020 04:57   US Abdomen Limited  Result Date: 07/24/2020 CLINICAL DATA:  Epigastric pain.  Elevated LFTs. EXAM: ULTRASOUND ABDOMEN LIMITED RIGHT UPPER QUADRANT COMPARISON:  CT scan Jul 21, 2020.  MRI Jul 22, 2020. FINDINGS: Gallbladder: Surgically absent Common bile duct: Diameter: 7.28 mm Liver: There is a 2.2 x 1.4 x 2.2 cm hyperechoic masslike region which correlates with the focal fatty deposition seen along the undersurface of the liver on the Jul 22, 2020 MRI. No other suspicious masses. Portal vein is patent on color Doppler imaging with normal direction of blood flow towards the liver. Other: None. IMPRESSION: 1. The 2.2 x 1.4 x 2.2 cm hyperechoic masslike region along the undersurface of the liver was noted to represent focal fatty deposition on the comparison MRI.  2. Previous cholecystectomy. 3. The common bile duct is mildly dilated, consistent with previous cholecystectomy. Electronically Signed   By: Gerome Sam III M.D   On: 07/24/2020 18:16    Medications:  Scheduled: . calcium-vitamin D  1 tablet Oral TID WC  . cephALEXin  500 mg Oral Q8H  . cholecalciferol  5,000 Units Oral Daily  .  pantoprazole (PROTONIX) IV  40 mg Intravenous Q12H  . senna-docusate  2 tablet Oral QHS  . vitamin B-12  5,000 mcg Oral BID   Continuous: . lactated ringers 100 mL/hr at 07/25/20 0036    Assessment/Plan: 1) Abdominal pain - ? Etiology. 2) Elevated liver enzymes - improved.   The patient does not report any improvement with her abdominal pain.  There are plans for her to be transferred to [facility], if a bed becomes available.  Her liver enzymes are still elevated, but there is a mild decline.  Plan: 1) Continue with supportive care. 2) Hopefully a transfer to [facility] can be obtained.  LOS: 4 days   Conleigh Heinlein D 07/26/2020, 7:59 AM

## 2020-07-27 LAB — COMPREHENSIVE METABOLIC PANEL
ALT: 62 U/L — ABNORMAL HIGH (ref 0–44)
AST: 35 U/L (ref 15–41)
Albumin: 2.9 g/dL — ABNORMAL LOW (ref 3.5–5.0)
Alkaline Phosphatase: 119 U/L (ref 38–126)
Anion gap: 5 (ref 5–15)
BUN: <5 mg/dL — ABNORMAL LOW (ref 6–20)
CO2: 28 mmol/L (ref 22–32)
Calcium: 8.5 mg/dL — ABNORMAL LOW (ref 8.9–10.3)
Chloride: 104 mmol/L (ref 98–111)
Creatinine, Ser: 0.64 mg/dL (ref 0.44–1.00)
GFR, Estimated: >60 mL/min (ref >60)
Glucose, Bld: 119 mg/dL — ABNORMAL HIGH (ref 70–99)
Potassium: 3.6 mmol/L (ref 3.5–5.1)
Sodium: 137 mmol/L (ref 135–145)
Total Bilirubin: 0.8 mg/dL (ref 0.3–1.2)
Total Protein: 4.7 g/dL — ABNORMAL LOW (ref 6.5–8.1)

## 2020-07-27 MED ORDER — HYDROMORPHONE HCL 1 MG/ML IJ SOLN
1.0000 mg | INTRAMUSCULAR | Status: DC | PRN
  Administered 2020-07-27 – 2020-07-28 (×3): 1 mg via INTRAVENOUS
  Filled 2020-07-27 (×4): qty 1

## 2020-07-27 MED ORDER — OXYCODONE HCL 5 MG PO TABS
5.0000 mg | ORAL_TABLET | Freq: Four times a day (QID) | ORAL | Status: DC | PRN
  Administered 2020-07-27: 5 mg via ORAL
  Filled 2020-07-27: qty 1

## 2020-07-27 NOTE — Progress Notes (Signed)
PROGRESS NOTE  Monique Floyd TMA:[id number] DOB: 03-29-1980 DOA: 07/21/2020 PCP: Gatha Mayer, MD   LOS: 5 days   Brief narrative:  Monique Floyd is a 40 y.o. female with medical history significant for previous knees arthritis, OSA with CPAP, morbid obesity status post bariatric surgery, laparoscopic pancreatic diversion, laparoscopic removal of lap band, laparoscopy cholecystectomy performed in 2019, laparoscopic repair and Phillip Heal patch, diagnostic laparoscopy and washout of the abdomen, EGD performed in 2019 at [facility] presented to hospital with sudden onset of severe epigastric pain radiating over the upper quadrants without any nausea constipation or diarrhea.  CT scan of the abdomen and pelvis without any acute findings.  MRI angiogram of the chest abdomen with negative as well.  Despite this she persisted to have abdominal pain and was admitted to hospital.  Lab work was normal as well.  GI was consulted and patient was admitted hospital for further evaluation and treatment.  Assessment/Plan:  Active Problems:   Abdominal pain   Elevated LFTs   Abdominal pain, epigastric  Severe epigastric pain/elevated LFTs, unclear etiology GI on board.  CT scan MRI negative. MRCP also unremarkable.  History of bariatric surgery laparoscopic pancreatic diabetes and removal of lap band and laparoscopic cholecystectomy in the past.  Her pain is worsened.  Some of the LFTs have improved but others have worsened.  GI recommends ERCP however due to her history of gastric bypass surgery and multiple other surgeries, GI here is unable to perform ERCP and has recommended transferring to tertiary care center.  Since patient has had her surgeries done at [facility], I called them and I was informed that they do not have any beds to accept this patient.  She also had worsening prompted repeat CT abdomen which was negative.  Per GI, Clinical suspicion remains high for small CBD stone(s) or sphincter of Oddi  dysfunction (SOD).  Dr. Fuller Plan discussed her care with Dr. Damita Lack, IR and Dr. Pascal Lux, IR,.   IR discussed with the patient about pros and cons of PTC however patient declined that.  Patient's LFTs have greatly improved and are almost normal however patient is still complains of severe pain with no improvement since admission.  She looks a lot better clinically.  I suspect psychosomatic component playing a major part here.  No other source of pain.  She has had MRCP and 2 CT scans of the abdomen.  I received a call back from hospitalist at [facility] yesterday who mentioned that he did end up discussing the case with biliary team yesterday who reviewed MRCP images again and based off of the fact that her LFTs are improving, they declined to offer her ERCP.  I did inform this to the patient.  I also confronted her about likelihood of psychosomatic pain after which patient became a little upset.  She firmly believes that there is something wrong with her abdomen which is not discovered yet.  I did reiterate her about her extensive work-up with 1 MRCP and 2 CT abdomen.  She would like to talk to GI tomorrow.  Upon chart review, it appears that patient has been asking for both Dilaudid and oxycodone around-the-clock every 4 hours and every 3 hours respectively.  I discussed with her reducing the dose and frequency of the pain medications.  She was okay with that.  I will reduce Dilaudid from 2 mg to 1 mg every 4 hours, keeping the same frequency and will continue same dose of oxycodone 5 mg but reduce frequency  from every 3 hours to every 6 hours.  Presumptive UTI, POA Completed 5 days of Keflex.  History of morbid obesity post bariatric surgery Current BMI 27. Patient follows up with surgeons at [facility]  DVT prophylaxis: enoxaparin (LOVENOX) injection 40 mg Start: 07/26/20 1600 SCDs Start: 07/21/20 1946   Code Status: Full code  Family Communication:  None  Status is: Inpatient  Remains inpatient appropriate  because:IV treatments appropriate due to intensity of illness or inability to take PO and Inpatient level of care appropriate due to severity of illness   Dispo:  Patient From: Home  Planned Disposition: Home  Medically stable for discharge: No     Consultants:  GI  Procedures:  None so far  Anti-infectives: . Keflex  Anti-infectives (From admission, onward)   Start     Dose/Rate Route Frequency Ordered Stop   07/21/20 2030  cephALEXin (KEFLEX) capsule 500 mg        500 mg Oral Every 8 hours 07/21/20 1958 07/26/20 1440      Subjective: Seen and examined.  Still complains of same and severe pain with no improvement.  Objective: Vitals:   07/26/20 2055 07/27/20 0440  BP: (!) 100/44 (!) 141/78  Pulse: (!) 55 (!) 51  Resp: 18 16  Temp: (!) 97.5 F (36.4 C) (!) 97.5 F (36.4 C)  SpO2: 99% 100%    Intake/Output Summary (Last 24 hours) at 07/27/2020 1223 Last data filed at 07/27/2020 1128 Gross per 24 hour  Intake 680 ml  Output --  Net 680 ml   Filed Weights   07/21/20 0857  Weight: 65.8 kg   Body mass index is 27.4 kg/m.   Physical Exam: General exam: Appears calm and comfortable  Respiratory system: Clear to auscultation. Respiratory effort normal. Cardiovascular system: S1 & S2 heard, RRR. No JVD, murmurs, rubs, gallops or clicks. No pedal edema. Gastrointestinal system: Abdomen is nondistended, soft and ?  Epigastric tenderness. No organomegaly or masses felt. Normal bowel sounds heard. Central nervous system: Alert and oriented. No focal neurological deficits. Extremities: Symmetric 5 x 5 power. Skin: No rashes, lesions or ulcers.  Psychiatry: Judgement and insight appear normal. Mood & affect appropriate.    Data Review: I have personally reviewed the following laboratory data and studies,  CBC: Recent Labs  Lab 07/21/20 0927 07/22/20 0157 07/23/20 0333 07/24/20 0425  WBC 7.7 7.4 6.1 5.3  NEUTROABS 4.5  --   --   --   HGB 15.6* 12.6 12.1  12.1  HCT 47.7* 38.4 37.3 37.2  MCV 99.4 100.3* 99.7 99.7  PLT 263 203 186 696   Basic Metabolic Panel: Recent Labs  Lab 07/22/20 0157 07/23/20 0333 07/24/20 0425 07/25/20 0140 07/26/20 0118 07/27/20 0225  NA 135 136 135 138 136 137  K 4.2 4.0 4.0 3.9 3.6 3.6  CL 109 104 104 107 104 104  CO2 18* 21* 17* $Remov'22 24 28  'XjCUnJ$ GLUCOSE 71 60* 51* 84 87 119*  BUN $Re'11 12 9 'ZdH$ 5* <5* <5*  CREATININE 0.64 0.73 0.75 0.63 0.54 0.64  CALCIUM 8.5* 8.6* 8.4* 8.5* 8.5* 8.5*  MG 1.9  --   --   --   --   --   PHOS 5.1*  --   --   --   --   --    Liver Function Tests: Recent Labs  Lab 07/23/20 0333 07/24/20 0425 07/25/20 0140 07/26/20 0118 07/27/20 0225  AST 227* 64* 48*  47* 36 35  ALT 201* 128* 97*  97* 75* 62*  ALKPHOS 192* 170* 142*  155* 131* 119  BILITOT 0.9 1.2 0.8  0.9 0.8 0.8  PROT 5.3* 5.1* 5.3*  5.2* 4.9* 4.7*  ALBUMIN 3.2* 3.1* 3.2*  3.1* 3.0* 2.9*   Recent Labs  Lab 07/21/20 0927 07/22/20 0157 07/25/20 0140  LIPASE 32 30 26   No results for input(s): AMMONIA in the last 168 hours. Cardiac Enzymes: No results for input(s): CKTOTAL, CKMB, CKMBINDEX, TROPONINI in the last 168 hours. BNP (last 3 results) No results for input(s): BNP in the last 8760 hours.  ProBNP (last 3 results) No results for input(s): PROBNP in the last 8760 hours.  CBG: No results for input(s): GLUCAP in the last 168 hours. Recent Results (from the past 240 hour(s))  Urine culture     Status: Abnormal   Collection Time: 07/21/20 10:50 AM   Specimen: Urine, Random  Result Value Ref Range Status   Specimen Description URINE, RANDOM  Final   Special Requests   Final    NONE Performed at [facility], [address], [place], [place] [address]    Culture MULTIPLE SPECIES PRESENT, SUGGEST RECOLLECTION (A)  Final   Report Status 07/22/2020 FINAL  Final  Resp Panel by RT-PCR (Flu A&B, Covid) Nasopharyngeal Swab     Status: None   Collection Time: 07/21/20  1:15 PM   Specimen: Nasopharyngeal  Swab; Nasopharyngeal(NP) swabs in vial transport medium  Result Value Ref Range Status   SARS Coronavirus 2 by RT PCR NEGATIVE NEGATIVE Final    Comment: (NOTE) SARS-CoV-2 target nucleic acids are NOT DETECTED.  The SARS-CoV-2 RNA is generally detectable in upper respiratory specimens during the acute phase of infection. The lowest concentration of SARS-CoV-2 viral copies this assay can detect is 138 copies/mL. A negative result does not preclude SARS-Cov-2 infection and should not be used as the sole basis for treatment or other patient management decisions. A negative result may occur with  improper specimen collection/handling, submission of specimen other than nasopharyngeal swab, presence of viral mutation(s) within the areas targeted by this assay, and inadequate number of viral copies(<138 copies/mL). A negative result must be combined with clinical observations, patient history, and epidemiological information. The expected result is Negative.  Fact Sheet for Patients:  EntrepreneurPulse.com.au  Fact Sheet for Healthcare Providers:  IncredibleEmployment.be  This test is no t yet approved or cleared by the [place] FDA and  has been authorized for detection and/or diagnosis of SARS-CoV-2 by FDA under an Emergency Use Authorization (EUA). This EUA will remain  in effect (meaning this test can be used) for the duration of the COVID-19 declaration under Section 564(b)(1) of the Act, 21 U.S.C.section 360bbb-3(b)(1), unless the authorization is terminated  or revoked sooner.       Influenza A by PCR NEGATIVE NEGATIVE Final   Influenza B by PCR NEGATIVE NEGATIVE Final    Comment: (NOTE) The Xpert Xpress SARS-CoV-2/FLU/RSV plus assay is intended as an aid in the diagnosis of influenza from Nasopharyngeal swab specimens and should not be used as a sole basis for treatment. Nasal washings and aspirates are unacceptable for Xpert Xpress  SARS-CoV-2/FLU/RSV testing.  Fact Sheet for Patients: EntrepreneurPulse.com.au  Fact Sheet for Healthcare Providers: IncredibleEmployment.be  This test is not yet approved or cleared by the [place] FDA and has been authorized for detection and/or diagnosis of SARS-CoV-2 by FDA under an Emergency Use Authorization (EUA). This EUA will remain in effect (meaning this test can be used) for the  duration of the COVID-19 declaration under Section 564(b)(1) of the Act, 21 U.S.C. section 360bbb-3(b)(1), unless the authorization is terminated or revoked.  Performed at [facility], [place] [address] [place], [place] [address]      Studies: No results found.  Darliss Cheney, MD  [facility] 07/27/2020  If 7PM-7AM, please contact night-coverage

## 2020-07-27 NOTE — Progress Notes (Signed)
Progress Note for [facility]  Subjective: The pain is still the same.  Objective: Vital signs in last 24 hours: Temp:  [97.5 F (36.4 C)-98 F (36.7 C)] 97.5 F (36.4 C) (05/15 0440) Pulse Rate:  [51-60] 51 (05/15 0440) Resp:  [16-18] 16 (05/15 0440) BP: (100-141)/(44-78) 141/78 (05/15 0440) SpO2:  [99 %-100 %] 100 % (05/15 0440) Last BM Date: 07/25/20  Intake/Output from previous day: 05/14 0701 - 05/15 0700 In: 960 [P.O.:960] Out: -  Intake/Output this shift: No intake/output data recorded.  General appearance: alert and appears to be in less discomfort GI: still tender to mild palpation.  Lab Results: No results for input(s): WBC, HGB, HCT, PLT in the last 72 hours. BMET Recent Labs    07/25/20 0140 07/26/20 0118 07/27/20 0225  NA 138 136 137  K 3.9 3.6 3.6  CL 107 104 104  CO2 22 24 28   GLUCOSE 84 87 119*  BUN 5* <5* <5*  CREATININE 0.63 0.54 0.64  CALCIUM 8.5* 8.5* 8.5*   LFT Recent Labs    07/25/20 0140 07/26/20 0118 07/27/20 0225  PROT 5.3*  5.2*   < > 4.7*  ALBUMIN 3.2*  3.1*   < > 2.9*  AST 48*  47*   < > 35  ALT 97*  97*   < > 62*  ALKPHOS 142*  155*   < > 119  BILITOT 0.8  0.9   < > 0.8  BILIDIR 0.1  --   --   IBILI 0.7  --   --    < > = values in this interval not displayed.   PT/INR No results for input(s): LABPROT, INR in the last 72 hours. Hepatitis Panel No results for input(s): HEPBSAG, HCVAB, HEPAIGM, HEPBIGM in the last 72 hours. C-Diff No results for input(s): CDIFFTOX in the last 72 hours. Fecal Lactopherrin No results for input(s): FECLLACTOFRN in the last 72 hours.  Studies/Results: No results found.  Medications:  Scheduled: . calcium-vitamin D  1 tablet Oral TID WC  . cholecalciferol  5,000 Units Oral Daily  . enoxaparin (LOVENOX) injection  40 mg Subcutaneous Q24H  . pantoprazole (PROTONIX) IV  40 mg Intravenous Q12H  . senna-docusate  2 tablet Oral QHS  . vitamin B-12  5,000 mcg Oral BID   Continuous: .  lactated ringers 100 mL/hr at 07/27/20 0425    Assessment/Plan: 1) Diffuse abdominal pain. 2) Abnormal liver enzymes - mildly improved.   The source of her pain is unknown.  Clinically she appears better, but she states that there is no change in her pain.  Plan: 1) Evaluation by Dr. 07/29/20 at [facility] inpatient versus outpatient. 2) Continue with supportive care. 3) [facility] to resume care in the AM.  LOS: 5 days   Monique Floyd D 07/27/2020, 8:48 AM

## 2020-07-28 LAB — CBC WITH DIFFERENTIAL/PLATELET
Abs Immature Granulocytes: 0.01 10*3/uL (ref 0.00–0.07)
Basophils Absolute: 0 10*3/uL (ref 0.0–0.1)
Basophils Relative: 1 %
Eosinophils Absolute: 0.1 10*3/uL (ref 0.0–0.5)
Eosinophils Relative: 3 %
HCT: 37.7 % (ref 36.0–46.0)
Hemoglobin: 12.7 g/dL (ref 12.0–15.0)
Immature Granulocytes: 0 %
Lymphocytes Relative: 32 %
Lymphs Abs: 1.2 10*3/uL (ref 0.7–4.0)
MCH: 32.8 pg (ref 26.0–34.0)
MCHC: 33.7 g/dL (ref 30.0–36.0)
MCV: 97.4 fL (ref 80.0–100.0)
Monocytes Absolute: 0.3 10*3/uL (ref 0.1–1.0)
Monocytes Relative: 7 %
Neutro Abs: 2.2 10*3/uL (ref 1.7–7.7)
Neutrophils Relative %: 57 %
Platelets: 160 10*3/uL (ref 150–400)
RBC: 3.87 MIL/uL (ref 3.87–5.11)
RDW: 12.9 % (ref 11.5–15.5)
WBC: 3.9 10*3/uL — ABNORMAL LOW (ref 4.0–10.5)
nRBC: 0 % (ref 0.0–0.2)

## 2020-07-28 LAB — COMPREHENSIVE METABOLIC PANEL
ALT: 47 U/L — ABNORMAL HIGH (ref 0–44)
AST: 24 U/L (ref 15–41)
Albumin: 2.6 g/dL — ABNORMAL LOW (ref 3.5–5.0)
Alkaline Phosphatase: 103 U/L (ref 38–126)
Anion gap: 4 — ABNORMAL LOW (ref 5–15)
BUN: <5 mg/dL — ABNORMAL LOW (ref 6–20)
CO2: 27 mmol/L (ref 22–32)
Calcium: 8.3 mg/dL — ABNORMAL LOW (ref 8.9–10.3)
Chloride: 109 mmol/L (ref 98–111)
Creatinine, Ser: 0.57 mg/dL (ref 0.44–1.00)
GFR, Estimated: >60 mL/min (ref >60)
Glucose, Bld: 87 mg/dL (ref 70–99)
Potassium: 3.4 mmol/L — ABNORMAL LOW (ref 3.5–5.1)
Sodium: 140 mmol/L (ref 135–145)
Total Bilirubin: 0.7 mg/dL (ref 0.3–1.2)
Total Protein: 4.4 g/dL — ABNORMAL LOW (ref 6.5–8.1)

## 2020-07-28 MED ORDER — POTASSIUM CHLORIDE CRYS ER 20 MEQ PO TBCR
40.0000 meq | EXTENDED_RELEASE_TABLET | Freq: Once | ORAL | Status: AC
  Administered 2020-07-28: 40 meq via ORAL
  Filled 2020-07-28: qty 2

## 2020-07-28 MED ORDER — HYDROMORPHONE HCL 1 MG/ML IJ SOLN
0.5000 mg | INTRAMUSCULAR | Status: DC | PRN
  Administered 2020-07-28 – 2020-07-29 (×3): 0.5 mg via INTRAVENOUS
  Filled 2020-07-28 (×3): qty 1

## 2020-07-28 MED ORDER — PANTOPRAZOLE SODIUM 40 MG PO TBEC
40.0000 mg | DELAYED_RELEASE_TABLET | Freq: Every day | ORAL | Status: DC
  Administered 2020-07-29 – 2020-07-31 (×3): 40 mg via ORAL
  Filled 2020-07-28 (×3): qty 1

## 2020-07-28 MED ORDER — OXYCODONE HCL 5 MG PO TABS
5.0000 mg | ORAL_TABLET | Freq: Three times a day (TID) | ORAL | Status: DC | PRN
  Administered 2020-07-28 – 2020-07-29 (×2): 5 mg via ORAL
  Filled 2020-07-28 (×2): qty 1

## 2020-07-28 MED ORDER — ENSURE ENLIVE PO LIQD
237.0000 mL | Freq: Two times a day (BID) | ORAL | Status: DC
  Administered 2020-07-28 – 2020-07-31 (×6): 237 mL via ORAL

## 2020-07-28 NOTE — Progress Notes (Signed)
Daily Rounding Note  07/28/2020, 10:44 AM  LOS: 6 days   SUBJECTIVE:   Chief complaint: Elevated LFTs, abdominal pain.  Now reporting blood per rectum     Patient now says she is passing blood per rectum on several occasions since admission.  Today's RN has not received report regarding any bleeding.  Has not had any solid or stool like material.  Insisting that she get a colonoscopy now.  Tells me she was told she should have a colonoscopy every 6 months because of her parents hx of colon cancer.  Pt reports no previous history colon polyps. Abdominal pain improved.  Appetite not great but tolerating clear liquids Used 8 mg Dilaudid, 15 mg oxycodone yesterday.  OBJECTIVE:         Vital signs in last 24 hours:    Temp:  [97.7 F (36.5 C)-98.5 F (36.9 C)] 97.7 F (36.5 C) (05/16 0450) Pulse Rate:  [51-55] 51 (05/16 0450) Resp:  [16-18] 16 (05/16 0450) BP: (114-132)/(73-87) 114/75 (05/16 0450) SpO2:  [96 %-100 %] 96 % (05/16 0450) Last BM Date: 07/25/20 Filed Weights   07/21/20 0857  Weight: 65.8 kg   General: Looks well, better than she did last week.  Comfortable. Heart: RRR Chest: Clear bilaterally.  No labored breathing or cough Abdomen: Soft.  Patient's response to exam out of proportion to light pressure.  Tenderness across the upper abdomen without guarding or rebound.  Active bowel sounds.  No distention Extremities: No CCE Neuro/Psych: Alert.  Oriented x3.  No tremors or limb weakness.  Intake/Output from previous day: 05/15 0701 - 05/16 0700 In: 1977.8 [P.O.:880; I.V.:1097.8] Out: -   Intake/Output this shift: No intake/output data recorded.  Lab Results: No results for input(s): WBC, HGB, HCT, PLT in the last 72 hours. BMET Recent Labs    07/26/20 0118 07/27/20 0225 07/28/20 0304  NA 136 137 140  K 3.6 3.6 3.4*  CL 104 104 109  CO2 24 28 27   GLUCOSE 87 119* 87  BUN <5* <5* <5*  CREATININE  0.54 0.64 0.57  CALCIUM 8.5* 8.5* 8.3*   LFT Recent Labs    07/26/20 0118 07/27/20 0225 07/28/20 0304  PROT 4.9* 4.7* 4.4*  ALBUMIN 3.0* 2.9* 2.6*  AST 36 35 24  ALT 75* 62* 47*  ALKPHOS 131* 119 103  BILITOT 0.8 0.8 0.7   PT/INR No results for input(s): LABPROT, INR in the last 72 hours. Hepatitis Panel No results for input(s): HEPBSAG, HCVAB, HEPAIGM, HEPBIGM in the last 72 hours.  Studies/Results: No results found.  ASSESMENT:   *   Epigastric pain, acutely elevated LFTs. 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  Unable to perform ERCP due to altered anatomy following 11/2017 biliary pancreatic diversion/duodenal switch bariatric surgery after failing wt loss w gastric band.  Previous cholecystectomy and repair of duodenoileostomy. [facility] unavailable to accept patient in transfer and LFTs improved so did not see need for transfer as on 5/14.  [facility] not currently performing ERCPs in this situation.. IR, Dr. 6/14 consulted 5/13 for consideration of biliary drain.  Patient and husband did not want to proceed with drainage, holding out hope for transfer to [facility] for possible ERCP. LFTs steadily improving.  Abdominal pain improved but persists.  Hospitalist down titrating narcotics.  *   Patient reported intermittent.  rectal bleeding.    Had colonoscopy in [facility] around 2018/2019.     *    Anxiety, contributing  to perception of pain.   PLAN   *    Per Dr Orvan Falconer.    *     ? D/C Lovenox.  I do not see urgency in doing this since Hb has been stable over 3 days.  *   Advance to full liquid diet.  Change to oral Protonix.    Jennye Moccasin  07/28/2020, 10:44 AM Phone [phone]

## 2020-07-28 NOTE — Progress Notes (Addendum)
PROGRESS NOTE  Monique Floyd OXB:[id number] DOB: 1980-04-13 DOA: 07/21/2020 PCP: Gatha Mayer, MD   LOS: 6 days   Brief narrative:  Monique Floyd is a 40 y.o. female with medical history significant for previous knees arthritis, OSA with CPAP, morbid obesity status post bariatric surgery, laparoscopic pancreatic diversion, laparoscopic removal of lap band, laparoscopy cholecystectomy performed in 2019, laparoscopic repair and Phillip Heal patch, diagnostic laparoscopy and washout of the abdomen, EGD performed in 2019 at [facility] presented to hospital with sudden onset of severe epigastric pain radiating over the upper quadrants without any nausea constipation or diarrhea.  CT scan of the abdomen and pelvis without any acute findings.  MRI angiogram of the chest abdomen with negative as well.  Despite this she persisted to have abdominal pain and was admitted to hospital.  Lab work was normal as well.  GI was consulted and patient was admitted hospital for further evaluation and treatment.  Assessment/Plan:  Active Problems:   Abdominal pain   Elevated LFTs   Abdominal pain, epigastric  Severe epigastric pain/elevated LFTs, unclear etiology GI on board.  CT scan MRI negative. MRCP also unremarkable.  History of bariatric surgery laparoscopic pancreatic diabetes and removal of lap band and laparoscopic cholecystectomy in the past.  Her pain is worsened.  Some of the LFTs have improved but others have worsened.  GI recommends ERCP however due to her history of gastric bypass surgery and multiple other surgeries, GI here is unable to perform ERCP and has recommended transferring to tertiary care center.  Since patient has had her surgeries done at [facility], I called them and I was informed that they do not have any beds to accept this patient.  She also had worsening prompted repeat CT abdomen which was negative.  Per GI, Clinical suspicion remains high for small CBD stone(s) or sphincter of Oddi  dysfunction (SOD).  Dr. Fuller Plan discussed her care with Dr. Damita Lack, IR and Dr. Pascal Lux, IR,.   IR discussed with the patient about pros and cons of PTC however patient declined that.  Patient's LFTs have greatly improved and are almost normal however patient is still complains of severe pain with no improvement since admission.  She looks a lot better clinically.  I suspect psychosomatic component playing a major part here.  No other source of pain.  She has had MRCP and 2 CT scans of the abdomen.  I received a call back from hospitalist at [facility] on 07/27/2020 who mentioned that he did end up discussing the case with biliary team yesterday who reviewed MRCP images again and based off of the fact that her LFTs are improving, they declined to offer her ERCP.  I did inform this to the patient.  Due to high suspicion of possible psychosomatic component to her pain, I reduced dosage and frequency of her pain medications.  She states that she feels better but still rates her pain as 8.5 out of 10 however she was sleeping peacefully when I entered in her room.  I will further reduce opioid pain medications, Dilaudid to 0.5 mg but continue same frequency which is every 4 hours and I will reduce oxycodone 5 mg from every 6 hours to every 8 hours.  Rectal bleeding: She claims that she had 1 episode of rectal bleeding with clots yesterday.  I have not been informed about this by nursing staff.  She believes that she needs colonoscopy and she is going to talk to GI about that.  I will check  her CBC  Hypokalemia: 3.4.  Replace today.   Presumptive UTI, POA Completed 5 days of Keflex.  History of morbid obesity post bariatric surgery Current BMI 27. Patient follows up with surgeons at [facility]  DVT prophylaxis: enoxaparin (LOVENOX) injection 40 mg Start: 07/26/20 1600 SCDs Start: 07/21/20 1946   Code Status: Full code  Family Communication:  None  Status is: Inpatient  Remains inpatient appropriate because:IV treatments  appropriate due to intensity of illness or inability to take PO and Inpatient level of care appropriate due to severity of illness   Dispo:  Patient From: Home  Planned Disposition: Home  Medically stable for discharge: No     Consultants:  GI  Procedures:  None so far  Anti-infectives: . Keflex  Anti-infectives (From admission, onward)   Start     Dose/Rate Route Frequency Ordered Stop   07/21/20 2030  cephALEXin (KEFLEX) capsule 500 mg        500 mg Oral Every 8 hours 07/21/20 1958 07/26/20 1440      Subjective: Seen and examined.  She states that she feels only slightly better but still rates her pain as 8.5 out of 10 however she was sleeping peacefully when I entered the room.  She is complaining of having 1 episode of rectal bleeding with clots yesterday, I have not been informed of such by any nursing staff.   Objective: Vitals:   07/27/20 2056 07/28/20 0450  BP: 132/73 114/75  Pulse: (!) 53 (!) 51  Resp:  16  Temp: 98.5 F (36.9 C) 97.7 F (36.5 C)  SpO2: 98% 96%    Intake/Output Summary (Last 24 hours) at 07/28/2020 1004 Last data filed at 07/28/2020 0601 Gross per 24 hour  Intake 1757.81 ml  Output --  Net 1757.81 ml   Filed Weights   07/21/20 0857  Weight: 65.8 kg   Body mass index is 27.4 kg/m.   Physical Exam: General exam: Appears calm and comfortable  Respiratory system: Clear to auscultation. Respiratory effort normal. Cardiovascular system: S1 & S2 heard, RRR. No JVD, murmurs, rubs, gallops or clicks. No pedal edema. Gastrointestinal system: Abdomen is nondistended, soft and still with severe epigastric and right upper quadrant tenderness. No organomegaly or masses felt. Normal bowel sounds heard. Central nervous system: Alert and oriented. No focal neurological deficits. Extremities: Symmetric 5 x 5 power. Skin: No rashes, lesions or ulcers.  Psychiatry: Judgement and insight appear normal. Mood & affect appropriate.   Data Review: I  have personally reviewed the following laboratory data and studies,  CBC: Recent Labs  Lab 07/22/20 0157 07/23/20 0333 07/24/20 0425  WBC 7.4 6.1 5.3  HGB 12.6 12.1 12.1  HCT 38.4 37.3 37.2  MCV 100.3* 99.7 99.7  PLT 203 186 597   Basic Metabolic Panel: Recent Labs  Lab 07/22/20 0157 07/23/20 0333 07/24/20 0425 07/25/20 0140 07/26/20 0118 07/27/20 0225 07/28/20 0304  NA 135   < > 135 138 136 137 140  K 4.2   < > 4.0 3.9 3.6 3.6 3.4*  CL 109   < > 104 107 104 104 109  CO2 18*   < > 17* $Rem'22 24 28 27  'agse$ GLUCOSE 71   < > 51* 84 87 119* 87  BUN 11   < > 9 5* <5* <5* <5*  CREATININE 0.64   < > 0.75 0.63 0.54 0.64 0.57  CALCIUM 8.5*   < > 8.4* 8.5* 8.5* 8.5* 8.3*  MG 1.9  --   --   --   --   --   --  PHOS 5.1*  --   --   --   --   --   --    < > = values in this interval not displayed.   Liver Function Tests: Recent Labs  Lab 07/24/20 0425 07/25/20 0140 07/26/20 0118 07/27/20 0225 07/28/20 0304  AST 64* 48*  47* 36 35 24  ALT 128* 97*  97* 75* 62* 47*  ALKPHOS 170* 142*  155* 131* 119 103  BILITOT 1.2 0.8  0.9 0.8 0.8 0.7  PROT 5.1* 5.3*  5.2* 4.9* 4.7* 4.4*  ALBUMIN 3.1* 3.2*  3.1* 3.0* 2.9* 2.6*   Recent Labs  Lab 07/22/20 0157 07/25/20 0140  LIPASE 30 26   No results for input(s): AMMONIA in the last 168 hours. Cardiac Enzymes: No results for input(s): CKTOTAL, CKMB, CKMBINDEX, TROPONINI in the last 168 hours. BNP (last 3 results) No results for input(s): BNP in the last 8760 hours.  ProBNP (last 3 results) No results for input(s): PROBNP in the last 8760 hours.  CBG: No results for input(s): GLUCAP in the last 168 hours. Recent Results (from the past 240 hour(s))  Urine culture     Status: Abnormal   Collection Time: 07/21/20 10:50 AM   Specimen: Urine, Random  Result Value Ref Range Status   Specimen Description URINE, RANDOM  Final   Special Requests   Final    NONE Performed at [facility], [address] [place],   [address]    Culture MULTIPLE SPECIES PRESENT, SUGGEST RECOLLECTION (A)  Final   Report Status 07/22/2020 FINAL  Final  Resp Panel by RT-PCR (Flu A&B, Covid) Nasopharyngeal Swab     Status: None   Collection Time: 07/21/20  1:15 PM   Specimen: Nasopharyngeal Swab; Nasopharyngeal(NP) swabs in vial transport medium  Result Value Ref Range Status   SARS Coronavirus 2 by RT PCR NEGATIVE NEGATIVE Final    Comment: (NOTE) SARS-CoV-2 target nucleic acids are NOT DETECTED.  The SARS-CoV-2 RNA is generally detectable in upper respiratory specimens during the acute phase of infection. The lowest concentration of SARS-CoV-2 viral copies this assay can detect is 138 copies/mL. A negative result does not preclude SARS-Cov-2 infection and should not be used as the sole basis for treatment or other patient management decisions. A negative result may occur with  improper specimen collection/handling, submission of specimen other than nasopharyngeal swab, presence of viral mutation(s) within the areas targeted by this assay, and inadequate number of viral copies(<138 copies/mL). A negative result must be combined with clinical observations, patient history, and epidemiological information. The expected result is Negative.  Fact Sheet for Patients:  EntrepreneurPulse.com.au  Fact Sheet for Healthcare Providers:  IncredibleEmployment.be  This test is no t yet approved or cleared by the [place] FDA and  has been authorized for detection and/or diagnosis of SARS-CoV-2 by FDA under an Emergency Use Authorization (EUA). This EUA will remain  in effect (meaning this test can be used) for the duration of the COVID-19 declaration under Section 564(b)(1) of the Act, 21 U.S.C.section 360bbb-3(b)(1), unless the authorization is terminated  or revoked sooner.       Influenza A by PCR NEGATIVE NEGATIVE Final   Influenza B by PCR NEGATIVE NEGATIVE Final    Comment:  (NOTE) The Xpert Xpress SARS-CoV-2/FLU/RSV plus assay is intended as an aid in the diagnosis of influenza from Nasopharyngeal swab specimens and should not be used as a sole basis for treatment. Nasal washings and aspirates are unacceptable for Xpert  Xpress SARS-CoV-2/FLU/RSV testing.  Fact Sheet for Patients: EntrepreneurPulse.com.au  Fact Sheet for Healthcare Providers: IncredibleEmployment.be  This test is not yet approved or cleared by the [place] FDA and has been authorized for detection and/or diagnosis of SARS-CoV-2 by FDA under an Emergency Use Authorization (EUA). This EUA will remain in effect (meaning this test can be used) for the duration of the COVID-19 declaration under Section 564(b)(1) of the Act, 21 U.S.C. section 360bbb-3(b)(1), unless the authorization is terminated or revoked.  Performed at [facility], [place] [address] [place], [place] [address]      Studies: No results found.  Darliss Cheney, MD  [facility] 07/28/2020  If 7PM-7AM, please contact night-coverage

## 2020-07-28 NOTE — Progress Notes (Incomplete)
Initial Nutrition Assessment  DOCUMENTATION CODES:      INTERVENTION:  ***  Ensure Enlive po BID, each supplement provides 350 kcal and 20 grams of protein  Add MVI with Minerals BID; pt to continue Bariatric MVI post discharge     NUTRITION DIAGNOSIS:     related to   as evidenced by  .  ***  GOAL:      ***  MONITOR:      REASON FOR ASSESSMENT:        ASSESSMENT:    40 yo female admitted with PMH includes gastric bypass,   Labs:  Meds:   ***   NUTRITION - FOCUSED PHYSICAL EXAM:  {RD Focused Exam List:21252}  Diet Order:   Diet Order            Diet full liquid Room service appropriate? Yes; Fluid consistency: Thin  Diet effective now                 EDUCATION NEEDS:      Skin:     Last BM:     Height:   Ht Readings from Last 1 Encounters:  07/21/20 5\' 1"  (1.549 m)    Weight:   Wt Readings from Last 1 Encounters:  07/21/20 65.8 kg    Ideal Body Weight:     BMI:  Body mass index is 27.4 kg/m.  Estimated Nutritional Needs:   Kcal:     Protein:     Fluid:      09/20/20 MS, RDN, LDN, CNSC Registered Dietitian III Clinical Nutrition RD Pager and On-Call Pager Number Located in [place]

## 2020-07-29 ENCOUNTER — Telehealth: Payer: Self-pay

## 2020-07-29 LAB — COMPREHENSIVE METABOLIC PANEL
ALT: 40 U/L (ref 0–44)
AST: 26 U/L (ref 15–41)
Albumin: 2.6 g/dL — ABNORMAL LOW (ref 3.5–5.0)
Alkaline Phosphatase: 95 U/L (ref 38–126)
Anion gap: 5 (ref 5–15)
BUN: <5 mg/dL — ABNORMAL LOW (ref 6–20)
CO2: 28 mmol/L (ref 22–32)
Calcium: 8.1 mg/dL — ABNORMAL LOW (ref 8.9–10.3)
Chloride: 108 mmol/L (ref 98–111)
Creatinine, Ser: 0.66 mg/dL (ref 0.44–1.00)
GFR, Estimated: >60 mL/min (ref >60)
Glucose, Bld: 90 mg/dL (ref 70–99)
Potassium: 3.6 mmol/L (ref 3.5–5.1)
Sodium: 141 mmol/L (ref 135–145)
Total Bilirubin: 0.4 mg/dL (ref 0.3–1.2)
Total Protein: 4.3 g/dL — ABNORMAL LOW (ref 6.5–8.1)

## 2020-07-29 MED ORDER — ALPRAZOLAM 0.25 MG PO TABS
0.2500 mg | ORAL_TABLET | Freq: Three times a day (TID) | ORAL | Status: DC
  Administered 2020-07-29 – 2020-07-30 (×4): 0.25 mg
  Filled 2020-07-29 (×4): qty 1

## 2020-07-29 MED ORDER — DICYCLOMINE HCL 10 MG PO CAPS
10.0000 mg | ORAL_CAPSULE | Freq: Three times a day (TID) | ORAL | Status: DC
  Administered 2020-07-29 – 2020-07-31 (×8): 10 mg via ORAL
  Filled 2020-07-29 (×8): qty 1

## 2020-07-29 MED ORDER — ADULT MULTIVITAMIN W/MINERALS CH
1.0000 | ORAL_TABLET | Freq: Two times a day (BID) | ORAL | Status: DC
  Administered 2020-07-29 – 2020-07-31 (×5): 1 via ORAL
  Filled 2020-07-29 (×5): qty 1

## 2020-07-29 MED ORDER — HYDROMORPHONE HCL 1 MG/ML IJ SOLN
0.5000 mg | Freq: Four times a day (QID) | INTRAMUSCULAR | Status: DC | PRN
  Administered 2020-07-29 – 2020-07-30 (×2): 0.5 mg via INTRAVENOUS
  Filled 2020-07-29 (×2): qty 1

## 2020-07-29 MED ORDER — OXYCODONE HCL 5 MG PO TABS
5.0000 mg | ORAL_TABLET | Freq: Two times a day (BID) | ORAL | Status: DC | PRN
  Administered 2020-07-29 – 2020-07-30 (×2): 5 mg via ORAL
  Filled 2020-07-29 (×2): qty 1

## 2020-07-29 NOTE — Progress Notes (Signed)
Initial Nutrition Assessment  DOCUMENTATION CODES:   Not applicable  INTERVENTION:   Ensure Enlive po BID, each supplement provides 350 kcal and 20 grams of protein  Add MVI with Minerals BID; pt to continue Bariatric MVI post discharge  Continue Calcium, Vitamin D and B12 supplementation. 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    NUTRITION DIAGNOSIS:   Increased nutrient needs related to altered GI function (hx of BPD-DS bariatric surgery) as evidenced by estimated needs.  GOAL:   Patient will meet greater than or equal to 90% of their needs  MONITOR:   PO intake,Supplement acceptance,Weight trends,Labs  REASON FOR ASSESSMENT:   Rounds    ASSESSMENT:   40 yo female admitted with abdominal pain with elevated LFTs. 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   Diet advanced to Soft. Working on her first soft meal tray on visit today. Pt did tolerate FL yesterday, noted 50% of breakfast recorded. Pt reports she did drink 1 Ensure yesterday and tolerated; plan to continue protein shakes for now. Pt had been NPO/CL x 6 days  Pt reports she had been eating well and feeling great until this acute issue.  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 Pt is at very high risk for vitamin and mineral deficiencies however based on pt's report, pt is very knowledgeable and has been compliant with all of her vitamins/minerals. Pt denies any changes to her hair, eyes/sight, mouth/taste, skin, mobility, etc  Pt reports she is unsure of her recent UBW, unsure if she has lost wt recently. Pt believes that post her 2019 surgeryy and the complications that followed (include infection, leak), she has been stable  Current wt 65.8 kg. Noted pt weighed 136 kg in 2013  Labs: potassium wdl Meds: Oscal with D TID, cholecalciferol, Vitamin B12   Diet Order:   Diet Order             Diet regular Room service appropriate? Yes; Fluid consistency: Thin  Diet effective now                 EDUCATION NEEDS:   Education needs have been addressed  Skin:  Skin Assessment: Reviewed RN Assessment  Last BM:  5/13  Height:   Ht Readings from Last 1 Encounters:  07/21/20 5\' 1"  (1.549 m)    Weight:   Wt Readings from Last 1 Encounters:  07/21/20 65.8 kg    BMI:  Body mass index is 27.4 kg/m.  Estimated Nutritional Needs:   Kcal:  2000-2300 kcals  Protein:  100-125 g  Fluid:  >/= 2 L   09/20/20 MS, RDN, LDN, CNSC Registered Dietitian III Clinical Nutrition RD Pager and On-Call Pager Number Located in [place]

## 2020-07-29 NOTE — Progress Notes (Addendum)
     [facility] Progress Note  CC:  I'm an 8 out of 10  Assessment / Plan: Likely passed gallstones/sludge/microcystals presenting with epigastric pain radiating to the back and abnormal liver enzymes: Clinically improving. Liver enzymes have normalized. No need for ERCP at this time. Outpatient referral to Dr. Edyth Gunnels at [facility] at patient's request is underway thanks to the [facility] office staff. Recommend diet as tolerated. Trial of dicyclomine for ongoing symptoms. Improving control of anxiety may ultimately help with GI symptoms. Hopefully home soon.   Single episode of rectal bleeding not witnessed by staff. No further bleeding. Hemoglobin stable. Had a prior colonoscopy 2018/2019 in [place].  Consider outpatient colonoscopy when her acute symptoms have completely resolved.   No additional inpatient GI evaluation planned at this time. The inpatient GI team will move to stand-by. Please call with any additional questions or concerns.   Subjective: Reports being an 8 out of 10, although abdominal pain has improved. No BM since yesterday although significant flatus. Tolerating full liquid diet and requesting a diet advance. Remains anxious about the cause of symptoms and prevention of recurrence. No further bleeding. No new complaints or concerns today. No family present at the time of my evaluation.   Objective:  Vital signs in last 24 hours: Temp:  [97.9 F (36.6 C)-98.5 F (36.9 C)] 97.9 F (36.6 C) (05/17 0514) Pulse Rate:  [55-66] 65 (05/17 0514) Resp:  [15-19] 17 (05/17 0514) BP: (113-137)/(63-84) 121/67 (05/17 0514) SpO2:  [97 %-100 %] 99 % (05/17 0514) Last BM Date: 07/25/20 General:   Alert, in NAD. Examined while she was sitting on the bedside sofa. Abdomen:  Soft. Mild tenderness that is out of proportion to light pressure applied. Nondistended. No tympany. Normal bowel sounds. No rebound or guarding. LAD: No inguinal or umbilical LAD Extremities:  Without  edema. Neurologic:  Alert and  oriented x4;  grossly normal neurologically. Psych:  Alert and cooperative. Normal mood and affect.  Lab Results: Recent Labs    07/28/20 1043  WBC 3.9*  HGB 12.7  HCT 37.7  PLT 160   BMET Recent Labs    07/27/20 0225 07/28/20 0304 07/29/20 0804  NA 137 140 141  K 3.6 3.4* 3.6  CL 104 109 108  CO2 28 27 28   GLUCOSE 119* 87 90  BUN <5* <5* <5*  CREATININE 0.64 0.57 0.66  CALCIUM 8.5* 8.3* 8.1*   LFT Recent Labs    07/29/20 0804  PROT 4.3*  ALBUMIN 2.6*  AST 26  ALT 40  ALKPHOS 95  BILITOT 0.4     LOS: 7 days   07/31/20  07/29/2020, 9:44 AM

## 2020-07-29 NOTE — Telephone Encounter (Signed)
-----   Message from Meryl Dare, MD sent at 07/29/2020  8:06 AM EDT ----- Her LFTs have almost normalized. She may have passed a CBD stone, sludge, microcrystal. SOD is also possible. Referral to Dr. Corliss Parish [facility] for further evaluation and mgmt.  MS ----- Message ----- From: Tressia Danas, MD Sent: 07/29/2020   8:01 AM EDT To: Annett Fabian, RN, Meryl Dare, MD  Please refer patient to Dr. Corliss Parish at [facility] for outpatient consultation of possible SOD dysfunction versus GB stones/sludge/microcystals. [facility] declined inpatient transfer. Please let me know the date if possible. The patient is very fixated on the details of her evaluation plan.   Thank you.   KLB

## 2020-07-29 NOTE — Telephone Encounter (Signed)
Referral faxed to [facility]- Patient will be contacted directly with an appointment date and time.

## 2020-07-29 NOTE — Progress Notes (Signed)
PROGRESS NOTE  Monique Floyd EUM:[id number] DOB: Apr 29, 1980 DOA: 07/21/2020 PCP: Gatha Mayer, MD   LOS: 7 days   Brief narrative:  Monique Floyd is a 40 y.o. female with medical history significant for previous knees arthritis, OSA with CPAP, morbid obesity status post bariatric surgery, laparoscopic pancreatic diversion, laparoscopic removal of lap band, laparoscopy cholecystectomy performed in 2019, laparoscopic repair and Phillip Heal patch, diagnostic laparoscopy and washout of the abdomen, EGD performed in 2019 at [facility] presented to hospital with sudden onset of severe epigastric pain radiating over the upper quadrants without any nausea constipation or diarrhea.  CT scan of the abdomen and pelvis without any acute findings.  MRI angiogram of the chest abdomen with negative as well.  Despite this she persisted to have abdominal pain and was admitted to hospital.  Lab work was normal as well.  GI was consulted and patient was admitted hospital for further evaluation and treatment.  Assessment/Plan:  Active Problems:   Abdominal pain   Elevated LFTs   Abdominal pain, epigastric  Severe epigastric pain/elevated LFTs, unclear etiology GI on board.  CT scan MRI negative. MRCP also unremarkable.  History of bariatric surgery laparoscopic pancreatic diabetes and removal of lap band and laparoscopic cholecystectomy in the past.  Her pain is worsened.  Some of the LFTs have improved but others have worsened.  GI recommends ERCP however due to her history of gastric bypass surgery and multiple other surgeries, GI here is unable to perform ERCP and has recommended transferring to tertiary care center.  Since patient has had her surgeries done at [facility], I called them and I was informed that they do not have any beds to accept this patient.  She also had worsening prompted repeat CT abdomen which was negative.  Per GI, Clinical suspicion remains high for small CBD stone(s) or sphincter of Oddi  dysfunction (SOD).  Dr. Fuller Plan discussed her care with Dr. Damita Lack, IR and Dr. Pascal Lux, IR,.   IR discussed with the patient about pros and cons of PTC however patient declined that.  Patient's LFTs have greatly improved and are almost normal however patient is still complains of severe pain with no improvement since admission.  She looks a lot better clinically.  I suspect psychosomatic component playing a major part here.  No other source of pain.  She has had MRCP and 2 CT scans of the abdomen.  I received a call back from hospitalist at [facility] on 07/27/2020 who mentioned that he did end up discussing the case with biliary team yesterday who reviewed MRCP images again and based off of the fact that her LFTs are improving, they declined to offer her ERCP.  I did inform this to the patient.  Due to high suspicion of possible psychosomatic component to her pain, I reduced dosage and frequency of her pain medications.  She rated her pain 8.5 out of 10 yesterday, down from 10-day before yesterday.  I further reduced opioid pain medications, Dilaudid to 0.5 mg from 1 mg but continue same frequency which is every 4 hours and reduced oxycodone 5 mg from every 6 hours to every 8 hours.  She rates her pain 8 out of 10 today, slight improvement.  Now her LFTs are fully normal.  She was seen by GI and they are basically on the sideline.  Patient continues to believe that there is something wrong with her that is why she is having abdominal pain.  No further recommendations from GI.  I will  further reduce her pain medications and will go down on Dilaudid same dose of 0.5 mg but reduce frequency to every 6 hours and reduce frequency of oxycodone 5 mg to every 12 hours.  Rectal bleeding: She claims that she had 1 episode of rectal bleeding with clots day before yesterday.  I have not been informed about this by nursing staff.  She believes that she needs colonoscopy however GI recommends outpatient colonoscopy.  Her hemoglobin is a  stable.  Hypokalemia: Resolved.  Presumptive UTI, POA Completed 5 days of Keflex.  History of morbid obesity post bariatric surgery Current BMI 27. Patient follows up with surgeons at [facility]  DVT prophylaxis: enoxaparin (LOVENOX) injection 40 mg Start: 07/26/20 1600 SCDs Start: 07/21/20 1946   Code Status: Full code  Family Communication:  None  Status is: Inpatient  Remains inpatient appropriate because:IV treatments appropriate due to intensity of illness or inability to take PO and Inpatient level of care appropriate due to severity of illness   Dispo:  Patient From: Home  Planned Disposition: Home  Medically stable for discharge: No     Consultants:  GI  Procedures:  None so far  Anti-infectives: . Keflex  Anti-infectives (From admission, onward)   Start     Dose/Rate Route Frequency Ordered Stop   07/21/20 2030  cephALEXin (KEFLEX) capsule 500 mg        500 mg Oral Every 8 hours 07/21/20 1958 07/26/20 1440      Subjective: Seen and examined.  Patient's primary nurse at the bedside.  Patient complains of pain 8 out of 10, slight improvement than yesterday.  She was advised to full liquid diet yesterday but she tells me that she could not tolerate that.  I will continue her on full liquid diet in an effort to advance it as she tolerates.  Objective: Vitals:   07/29/20 0027 07/29/20 0514  BP: 115/72 121/67  Pulse: (!) 55 65  Resp: 16 17  Temp: 98 F (36.7 C) 97.9 F (36.6 C)  SpO2: 100% 99%    Intake/Output Summary (Last 24 hours) at 07/29/2020 1014 Last data filed at 07/28/2020 1431 Gross per 24 hour  Intake 695.44 ml  Output --  Net 695.44 ml   Filed Weights   07/21/20 0857  Weight: 65.8 kg   Body mass index is 27.4 kg/m.   Physical Exam: General exam: Appears calm and comfortable  Respiratory system: Clear to auscultation. Respiratory effort normal. Cardiovascular system: S1 & S2 heard, RRR. No JVD, murmurs, rubs, gallops or clicks. No  pedal edema. Gastrointestinal system: Abdomen is nondistended, soft and epigastric and right upper quadrant tenderness, out of proportion. No organomegaly or masses felt. Normal bowel sounds heard. Central nervous system: Alert and oriented. No focal neurological deficits. Extremities: Symmetric 5 x 5 power. Skin: No rashes, lesions or ulcers.    Data Review: I have personally reviewed the following laboratory data and studies,  CBC: Recent Labs  Lab 07/23/20 0333 07/24/20 0425 07/28/20 1043  WBC 6.1 5.3 3.9*  NEUTROABS  --   --  2.2  HGB 12.1 12.1 12.7  HCT 37.3 37.2 37.7  MCV 99.7 99.7 97.4  PLT 186 171 322   Basic Metabolic Panel: Recent Labs  Lab 07/25/20 0140 07/26/20 0118 07/27/20 0225 07/28/20 0304 07/29/20 0804  NA 138 136 137 140 141  K 3.9 3.6 3.6 3.4* 3.6  CL 107 104 104 109 108  CO2 $Re'22 24 28 27 28  'KEw$ GLUCOSE 84 87 119* 87  90  BUN 5* <5* <5* <5* <5*  CREATININE 0.63 0.54 0.64 0.57 0.66  CALCIUM 8.5* 8.5* 8.5* 8.3* 8.1*   Liver Function Tests: Recent Labs  Lab 07/25/20 0140 07/26/20 0118 07/27/20 0225 07/28/20 0304 07/29/20 0804  AST 48*  47* 36 35 24 26  ALT 97*  97* 75* 62* 47* 40  ALKPHOS 142*  155* 131* 119 103 95  BILITOT 0.8  0.9 0.8 0.8 0.7 0.4  PROT 5.3*  5.2* 4.9* 4.7* 4.4* 4.3*  ALBUMIN 3.2*  3.1* 3.0* 2.9* 2.6* 2.6*   Recent Labs  Lab 07/25/20 0140  LIPASE 26   No results for input(s): AMMONIA in the last 168 hours. Cardiac Enzymes: No results for input(s): CKTOTAL, CKMB, CKMBINDEX, TROPONINI in the last 168 hours. BNP (last 3 results) No results for input(s): BNP in the last 8760 hours.  ProBNP (last 3 results) No results for input(s): PROBNP in the last 8760 hours.  CBG: No results for input(s): GLUCAP in the last 168 hours. Recent Results (from the past 240 hour(s))  Urine culture     Status: Abnormal   Collection Time: 07/21/20 10:50 AM   Specimen: Urine, Random  Result Value Ref Range Status   Specimen  Description URINE, RANDOM  Final   Special Requests   Final    NONE Performed at [facility], [address], [place], [place] [address]    Culture MULTIPLE SPECIES PRESENT, SUGGEST RECOLLECTION (A)  Final   Report Status 07/22/2020 FINAL  Final  Resp Panel by RT-PCR (Flu A&B, Covid) Nasopharyngeal Swab     Status: None   Collection Time: 07/21/20  1:15 PM   Specimen: Nasopharyngeal Swab; Nasopharyngeal(NP) swabs in vial transport medium  Result Value Ref Range Status   SARS Coronavirus 2 by RT PCR NEGATIVE NEGATIVE Final    Comment: (NOTE) SARS-CoV-2 target nucleic acids are NOT DETECTED.  The SARS-CoV-2 RNA is generally detectable in upper respiratory specimens during the acute phase of infection. The lowest concentration of SARS-CoV-2 viral copies this assay can detect is 138 copies/mL. A negative result does not preclude SARS-Cov-2 infection and should not be used as the sole basis for treatment or other patient management decisions. A negative result may occur with  improper specimen collection/handling, submission of specimen other than nasopharyngeal swab, presence of viral mutation(s) within the areas targeted by this assay, and inadequate number of viral copies(<138 copies/mL). A negative result must be combined with clinical observations, patient history, and epidemiological information. The expected result is Negative.  Fact Sheet for Patients:  EntrepreneurPulse.com.au  Fact Sheet for Healthcare Providers:  IncredibleEmployment.be  This test is no t yet approved or cleared by the [place] FDA and  has been authorized for detection and/or diagnosis of SARS-CoV-2 by FDA under an Emergency Use Authorization (EUA). This EUA will remain  in effect (meaning this test can be used) for the duration of the COVID-19 declaration under Section 564(b)(1) of the Act, 21 U.S.C.section 360bbb-3(b)(1), unless the authorization is  terminated  or revoked sooner.       Influenza A by PCR NEGATIVE NEGATIVE Final   Influenza B by PCR NEGATIVE NEGATIVE Final    Comment: (NOTE) The Xpert Xpress SARS-CoV-2/FLU/RSV plus assay is intended as an aid in the diagnosis of influenza from Nasopharyngeal swab specimens and should not be used as a sole basis for treatment. Nasal washings and aspirates are unacceptable for Xpert Xpress SARS-CoV-2/FLU/RSV testing.  Fact Sheet for Patients: EntrepreneurPulse.com.au  Fact Sheet  for Healthcare Providers: IncredibleEmployment.be  This test is not yet approved or cleared by the [place] and has been authorized for detection and/or diagnosis of SARS-CoV-2 by FDA under an Emergency Use Authorization (EUA). This EUA will remain in effect (meaning this test can be used) for the duration of the COVID-19 declaration under Section 564(b)(1) of the Act, 21 U.S.C. section 360bbb-3(b)(1), unless the authorization is terminated or revoked.  Performed at [facility], [place] [address] [place], [place] [address]      Studies: No results found.  Darliss Cheney, MD  [facility] 07/29/2020  If 7PM-7AM, please contact night-coverage

## 2020-07-30 LAB — COMPREHENSIVE METABOLIC PANEL
ALT: 35 U/L (ref 0–44)
AST: 20 U/L (ref 15–41)
Albumin: 2.7 g/dL — ABNORMAL LOW (ref 3.5–5.0)
Alkaline Phosphatase: 86 U/L (ref 38–126)
Anion gap: 5 (ref 5–15)
BUN: 5 mg/dL — ABNORMAL LOW (ref 6–20)
CO2: 26 mmol/L (ref 22–32)
Calcium: 8.1 mg/dL — ABNORMAL LOW (ref 8.9–10.3)
Chloride: 108 mmol/L (ref 98–111)
Creatinine, Ser: 0.64 mg/dL (ref 0.44–1.00)
GFR, Estimated: >60 mL/min (ref >60)
Glucose, Bld: 87 mg/dL (ref 70–99)
Potassium: 3.7 mmol/L (ref 3.5–5.1)
Sodium: 139 mmol/L (ref 135–145)
Total Bilirubin: 0.5 mg/dL (ref 0.3–1.2)
Total Protein: 4.4 g/dL — ABNORMAL LOW (ref 6.5–8.1)

## 2020-07-30 MED ORDER — HYDROMORPHONE HCL 2 MG PO TABS
1.0000 mg | ORAL_TABLET | ORAL | Status: DC | PRN
  Administered 2020-07-30 – 2020-07-31 (×4): 1 mg via ORAL
  Filled 2020-07-30 (×4): qty 1

## 2020-07-30 MED ORDER — TRAMADOL HCL 50 MG PO TABS
50.0000 mg | ORAL_TABLET | Freq: Four times a day (QID) | ORAL | Status: DC | PRN
  Administered 2020-07-30: 50 mg via ORAL
  Filled 2020-07-30: qty 1

## 2020-07-30 MED ORDER — METHOCARBAMOL 750 MG PO TABS
750.0000 mg | ORAL_TABLET | Freq: Four times a day (QID) | ORAL | Status: DC | PRN
  Administered 2020-07-30 – 2020-07-31 (×3): 750 mg via ORAL
  Filled 2020-07-30 (×3): qty 1

## 2020-07-30 MED ORDER — METOCLOPRAMIDE HCL 5 MG/ML IJ SOLN
5.0000 mg | Freq: Once | INTRAMUSCULAR | Status: AC
  Administered 2020-07-30: 5 mg via INTRAVENOUS
  Filled 2020-07-30: qty 2

## 2020-07-30 MED ORDER — OXYCODONE HCL 5 MG PO TABS: 5.0000 mg | ORAL_TABLET | Freq: Two times a day (BID) | ORAL | Status: DC | PRN

## 2020-07-30 MED ORDER — ALPRAZOLAM 0.25 MG PO TABS
0.2500 mg | ORAL_TABLET | Freq: Three times a day (TID) | ORAL | Status: DC
  Administered 2020-07-30 – 2020-07-31 (×3): 0.25 mg via ORAL
  Filled 2020-07-30 (×3): qty 1

## 2020-07-30 NOTE — Progress Notes (Signed)
Monique Floyd  CXK:[id number] DOB: 01/24/1981 DOA: 07/21/2020 PCP: Iva Boop, MD    Brief Narrative:  40 year old with a history of OSA on CPAP, morbid obesity status post bariatric surgery and subsequent complex abdominal surgical history, laparoscopic pancreatic diversion, cholecystectomy, and arthritis of the knees who presented to the ED with a sudden onset of severe epigastric pain.  CT abdomen/pelvis failed to reveal any acute findings.  MRI angiogram of the chest and abdomen was negative as well.  Consultants:  Gastroenterology  Code Status: FULL CODE  Antimicrobials:  Keflex 5/9 > 5/14  DVT prophylaxis: Lovenox  Subjective: Afebrile.  Vital signs stable.  LFTs have normalized.  Continues to report severe abdominal pain.  Denies chest pain nausea or vomiting.  Reports she has very limited appetite or intake due to the pain.  Assessment & Plan:  Epigastric pain -transaminitis CT and MRI negative -MRCP unremarkable -complex abdominal history to include placement and subsequent later removal of lap band as well as pancreatic diversion and laparoscopic cholecystectomy -GI suggested ERCP but patient requires transfer to a tertiary care center for this -[facility] who has provided most of the patient's care does not have room for the patient at present -present suspicion is that of small common bile duct stone or sphincter of Oddi dysfunction -cholecystostomy tube per IR was considered but patient refused -clinically the patient has ultimately improved over the course of her stay -[facility] ultimately followed up with the patient and based upon her studies did not feel that she needed an ERCP -GI is arranging for an outpatient evaluation in the [facility] GI clinic  Possible rectal bleeding Patient reported a single episode of rectal bleeding during her hospital stay but this was not noted by nursing staff -she has been clinically stable from this regard -GI has suggested an  outpatient colonoscopy  Hypokalemia Corrected with supplementation  Possible UTI POA Has completed 5 days of empiric antibiotic therapy  History of morbid obesity status post bariatric surgery Followed at [facility] -being referred back to Dr. Jamse Mead at [facility] GI clinic per [facility] office  Family Communication:  Status is: Inpatient  Remains inpatient appropriate because:Inpatient level of care appropriate due to severity of illness   Dispo:  Patient From: Home  Planned Disposition: Home  Medically stable for discharge: No      Objective: Blood pressure (!) 127/58, pulse 61, temperature 97.8 F (36.6 C), temperature source Oral, resp. rate 18, height 5\' 1"  (1.549 m), weight 65.8 kg, SpO2 100 %. No intake or output data in the 24 hours ending 07/30/20 0917 Filed Weights   07/21/20 0857  Weight: 65.8 kg    Examination: General: No acute respiratory distress Lungs: Clear to auscultation bilaterally without wheezes or crackles Cardiovascular: Regular rate and rhythm without murmur gallop or rub normal S1 and S2 Abdomen: Mildly tender diffusely, nondistended, soft, bowel sounds positive, no rebound, no ascites, no appreciable mass Extremities: No significant cyanosis, clubbing, or edema bilateral lower extremities  CBC: Recent Labs  Lab 07/24/20 0425 07/28/20 1043  WBC 5.3 3.9*  NEUTROABS  --  2.2  HGB 12.1 12.7  HCT 37.2 37.7  MCV 99.7 97.4  PLT 171 160   Basic Metabolic Panel: Recent Labs  Lab 07/28/20 0304 07/29/20 0804 07/30/20 0209  NA 140 141 139  K 3.4* 3.6 3.7  CL 109 108 108  CO2 27 28 26   GLUCOSE 87 90 87  BUN <5* <5* 5*  CREATININE 0.57  0.66 0.64  CALCIUM 8.3* 8.1* 8.1*   GFR: Estimated Creatinine Clearance: 79.5 mL/min (by C-G formula based on SCr of 0.64 mg/dL).  Liver Function Tests: Recent Labs  Lab 07/27/20 0225 07/28/20 0304 07/29/20 0804 07/30/20 0209  AST 35 24 26 20   ALT 62* 47* 40 35  ALKPHOS 119 103 95 86  BILITOT  0.8 0.7 0.4 0.5  PROT 4.7* 4.4* 4.3* 4.4*  ALBUMIN 2.9* 2.6* 2.6* 2.7*   Recent Labs  Lab 07/25/20 0140  LIPASE 26    Recent Results (from the past 240 hour(s))  Urine culture     Status: Abnormal   Collection Time: 07/21/20 10:50 AM   Specimen: Urine, Random  Result Value Ref Range Status   Specimen Description URINE, RANDOM  Final   Special Requests   Final    NONE Performed at [facility], [address] [place], [place]    Culture MULTIPLE SPECIES PRESENT, SUGGEST RECOLLECTION (A)  Final   Report Status 07/22/2020 FINAL  Final  Resp Panel by RT-PCR (Flu A&B, Covid) Nasopharyngeal Swab     Status: None   Collection Time: 07/21/20  1:15 PM   Specimen: Nasopharyngeal Swab; Nasopharyngeal(NP) swabs in vial transport medium  Result Value Ref Range Status   SARS Coronavirus 2 by RT PCR NEGATIVE NEGATIVE Final    Comment: (NOTE) SARS-CoV-2 target nucleic acids are NOT DETECTED.  The SARS-CoV-2 RNA is generally detectable in upper respiratory specimens during the acute phase of infection. The lowest concentration of SARS-CoV-2 viral copies this assay can detect is 138 copies/mL. A negative result does not preclude SARS-Cov-2 infection and should not be used as the sole basis for treatment or other patient management decisions. A negative result may occur with  improper specimen collection/handling, submission of specimen other than nasopharyngeal swab, presence of viral mutation(s) within the areas targeted by this assay, and inadequate number of viral copies(<138 copies/mL). A negative result must be combined with clinical observations, patient history, and epidemiological information. The expected result is Negative.  Fact Sheet for Patients:  09/20/20  Fact Sheet for Healthcare Providers:  BloggerCourse.com  This test is no t yet approved or cleared by the [place] FDA and  has been  authorized for detection and/or diagnosis of SARS-CoV-2 by FDA under an Emergency Use Authorization (EUA). This EUA will remain  in effect (meaning this test can be used) for the duration of the COVID-19 declaration under Section 564(b)(1) of the Act, 21 U.S.C.section 360bbb-3(b)(1), unless the authorization is terminated  or revoked sooner.       Influenza A by PCR NEGATIVE NEGATIVE Final   Influenza B by PCR NEGATIVE NEGATIVE Final    Comment: (NOTE) The Xpert Xpress SARS-CoV-2/FLU/RSV plus assay is intended as an aid in the diagnosis of influenza from Nasopharyngeal swab specimens and should not be used as a sole basis for treatment. Nasal washings and aspirates are unacceptable for Xpert Xpress SARS-CoV-2/FLU/RSV testing.  Fact Sheet for Patients: [place]  Fact Sheet for Healthcare Providers: BloggerCourse.com  This test is not yet approved or cleared by the [place] FDA and has been authorized for detection and/or diagnosis of SARS-CoV-2 by FDA under an Emergency Use Authorization (EUA). This EUA will remain in effect (meaning this test can be used) for the duration of the COVID-19 declaration under Section 564(b)(1) of the Act, 21 U.S.C. section 360bbb-3(b)(1), unless the authorization is terminated or revoked.  Performed at [facility], [address] [place],  [place] [address]      Scheduled Meds: . ALPRAZolam  0.25 mg Per Tube TID  . calcium-vitamin D  1 tablet Oral TID WC  . cholecalciferol  5,000 Units Oral Daily  . dicyclomine  10 mg Oral TID AC & HS  . enoxaparin (LOVENOX) injection  40 mg Subcutaneous Q24H  . feeding supplement  237 mL Oral BID BM  . multivitamin with minerals  1 tablet Oral BID  . pantoprazole  40 mg Oral Q0600  . senna-docusate  2 tablet Oral QHS  . vitamin B-12  5,000 mcg Oral BID   Continuous Infusions: . lactated ringers 100 mL/hr at 07/29/20 1841     LOS: 8  days   Lonia Blood, MD [facility] Office  [phone] Pager - Text Page per Loretha Stapler  If 7PM-7AM, please contact night-coverage per Amion 07/30/2020, 9:17 AM

## 2020-07-31 ENCOUNTER — Other Ambulatory Visit (HOSPITAL_COMMUNITY): Payer: Self-pay

## 2020-07-31 LAB — CBC
HCT: 35.3 % — ABNORMAL LOW (ref 36.0–46.0)
Hemoglobin: 11.6 g/dL — ABNORMAL LOW (ref 12.0–15.0)
MCH: 32.9 pg (ref 26.0–34.0)
MCHC: 32.9 g/dL (ref 30.0–36.0)
MCV: 100 fL (ref 80.0–100.0)
Platelets: 161 10*3/uL (ref 150–400)
RBC: 3.53 MIL/uL — ABNORMAL LOW (ref 3.87–5.11)
RDW: 13.1 % (ref 11.5–15.5)
WBC: 4.4 10*3/uL (ref 4.0–10.5)
nRBC: 0 % (ref 0.0–0.2)

## 2020-07-31 LAB — COMPREHENSIVE METABOLIC PANEL
ALT: 35 U/L (ref 0–44)
AST: 24 U/L (ref 15–41)
Albumin: 2.8 g/dL — ABNORMAL LOW (ref 3.5–5.0)
Alkaline Phosphatase: 79 U/L (ref 38–126)
Anion gap: 3 — ABNORMAL LOW (ref 5–15)
BUN: 9 mg/dL (ref 6–20)
CO2: 27 mmol/L (ref 22–32)
Calcium: 8.1 mg/dL — ABNORMAL LOW (ref 8.9–10.3)
Chloride: 109 mmol/L (ref 98–111)
Creatinine, Ser: 0.75 mg/dL (ref 0.44–1.00)
GFR, Estimated: >60 mL/min (ref >60)
Glucose, Bld: 90 mg/dL (ref 70–99)
Potassium: 4.1 mmol/L (ref 3.5–5.1)
Sodium: 139 mmol/L (ref 135–145)
Total Bilirubin: 0.4 mg/dL (ref 0.3–1.2)
Total Protein: 4.8 g/dL — ABNORMAL LOW (ref 6.5–8.1)

## 2020-07-31 LAB — MAGNESIUM: Magnesium: 2 mg/dL (ref 1.7–2.4)

## 2020-07-31 MED ORDER — HYDROMORPHONE HCL 2 MG PO TABS
1.0000 mg | ORAL_TABLET | ORAL | 0 refills | Status: DC
  Filled 2020-07-31: qty 20, 7d supply, fill #0

## 2020-07-31 MED ORDER — ALPRAZOLAM 0.25 MG PO TABS
0.2500 mg | ORAL_TABLET | Freq: Three times a day (TID) | ORAL | 0 refills | Status: DC
  Filled 2020-07-31: qty 60, 20d supply, fill #0

## 2020-07-31 MED ORDER — ACETAMINOPHEN 325 MG PO TABS
650.0000 mg | ORAL_TABLET | Freq: Four times a day (QID) | ORAL | Status: DC | PRN
Start: 2020-07-31 — End: 2020-10-15

## 2020-07-31 MED ORDER — HYDROMORPHONE HCL 2 MG PO TABS: 1.0000 mg | ORAL_TABLET | ORAL | 0 refills | Status: DC | PRN

## 2020-07-31 MED ORDER — METHOCARBAMOL 750 MG PO TABS: 750.0000 mg | ORAL_TABLET | Freq: Four times a day (QID) | ORAL | 0 refills | Status: DC | PRN

## 2020-07-31 MED ORDER — HYDROXYZINE HCL 25 MG PO TABS: 25.0000 mg | ORAL_TABLET | Freq: Three times a day (TID) | ORAL | 0 refills | Status: DC | PRN

## 2020-07-31 MED ORDER — ALPRAZOLAM 0.25 MG PO TABS: 0.2500 mg | ORAL_TABLET | Freq: Three times a day (TID) | ORAL | 0 refills | Status: DC

## 2020-07-31 NOTE — Discharge Summary (Signed)
DISCHARGE SUMMARY  Monique Floyd  MR#: [id number]  DOB:11/15/1980  Date of Admission: 07/21/2020 Date of Discharge: 07/31/2020  Attending Physician:Zarria Towell Silvestre Gunner, MD  Patient's GUY:[id number], Monique Morn, MD  Consults: [facility]  Disposition: D/C home    Follow-up Appts:  Follow-up Information    Iva Boop, MD. Schedule an appointment as soon as possible for a visit in 1 week(s).   Specialty: Gastroenterology Why: Call the clinic if you do not hear from them within 3 days regarding your referral to [facility].  Contact information: [address] [place] [address] [phone]        Your Primary Medical Doctor Follow up.   Why: See your primary medical doctor in 5-7 days to check up on your abdominal pain, and to decide if refills of your pain and anxiety medications are appropriate.               Tests Needing Follow-up: -consider outpt colonoscopy   Discharge Diagnoses: Epigastric pain Transaminitis Possible rectal bleeding Hypokalemia Possible UTI POA History of morbid obesity status post bariatric surgery  Initial presentation: 40yo with a history of OSA on CPAP, morbid obesity status post bariatric surgery and subsequent complex abdominal surgical history, laparoscopic pancreatic diversion, cholecystectomy, and arthritis of the knees who presented to the ED with a sudden onset of severe epigastric pain.  CT abdomen/pelvis failed to reveal any acute findings.  MRI angiogram of the chest and abdomen was negative as well.  Hospital Course:  Epigastric pain - transaminitis CT and MRI negative -MRCP unremarkable -complex abdominal history to include placement and subsequent later removal of lap band as well as pancreatic diversion and laparoscopic cholecystectomy -GI suggested ERCP but patient requires transfer to a tertiary care center for this -[facility] who has provided most of the patient's care does not have room for the  patient at present -present suspicion is that of small common bile duct stone or sphincter of Oddi dysfunction -cholecystostomy tube per IR was considered but patient refused -clinically the patient has ultimately improved over the course of her stay -[facility] ultimately followed up with the patient and based upon her studies did not feel that she needed an ERCP -GI is arranging for an outpatient evaluation in the [facility] GI clinic - at time of d/c pain much better controlled and pt able to tolerate oral intake - pt given short course of pain meds and muscle relaxer for use at home w/ warning to not drive while taking - educated that intention is for short course of sx control at home but for ultimate tx to be determined w/ further w/u at [facility] clinic   Possible rectal bleeding Patient reported a single episode of rectal bleeding during her hospital stay but this was not noted by nursing staff -she has been clinically stable from this regard -GI has suggested an outpatient colonoscopy  Hypokalemia Corrected with supplementation  Possible UTI POA completed 5 days of empiric antibiotic therapy  History of morbid obesity status post bariatric surgery Followed at [facility] -being referred back to Dr. Jamse Mead at [facility] clinic per [facility] office  Allergies as of 07/31/2020      Reactions   Bee Venom Shortness Of Breath, Swelling   Codeine Nausea And Vomiting   Contrast Media [iodinated Diagnostic Agents] Anaphylaxis   Hydromorphone Nausea And Vomiting      Medication List    TAKE these medications   acetaminophen 325 MG tablet  Commonly known as: TYLENOL Take 2 tablets (650 mg total) by mouth every 6 (six) hours as needed for mild pain, fever or headache.   ALPRAZolam 0.25 MG tablet Commonly known as: XANAX Take 1 tablet (0.25 mg total) by mouth 3 (three) times daily.   B-12 Super Strength 5000 MCG/ML Liqd Generic drug: Cyanocobalamin Place 5,000 mcg under the tongue 2  (two) times daily.   Calcium Plus Vitamin D 500-200 MG-UNIT Tabs Generic drug: Calcium Carb-Cholecalciferol Take 1 tablet by mouth 3 (three) times daily.   HYDROmorphone 2 MG tablet Commonly known as: DILAUDID Take 0.5 tablets (1 mg total) by mouth every 4 (four) hours as needed for severe pain.   hydrOXYzine 25 MG tablet Commonly known as: ATARAX/VISTARIL Take 1 tablet (25 mg total) by mouth every 8 (eight) hours as needed for anxiety.   IRON-C PO Take 1 tablet by mouth at bedtime.   methocarbamol 750 MG tablet Commonly known as: ROBAXIN Take 1 tablet (750 mg total) by mouth every 6 (six) hours as needed for muscle spasms.   OVER THE COUNTER MEDICATION Take 1 tablet by mouth 2 (two) times daily. Celebrate-ADEK (Barriatric vitamin)   Vitamin D3 125 MCG (5000 UT) Tabs Take 5,000 Units by mouth daily.       Day of Discharge BP (!) 106/58 (BP Location: Left Arm)   Pulse (!) 56   Temp 98.4 F (36.9 C)   Resp 18   Ht 5\' 1"  (1.549 m)   Wt 79.4 kg   SpO2 100%   BMI 33.07 kg/m   Physical Exam: General: No acute respiratory distress Lungs: Clear to auscultation bilaterally without wheezes or crackles Cardiovascular: Regular rate and rhythm without murmur gallop or rub normal S1 and S2 Abdomen: Nontender, nondistended, soft, bowel sounds positive, no rebound, no ascites, no appreciable mass Extremities: No significant cyanosis, clubbing, or edema bilateral lower extremities  Basic Metabolic Panel: Recent Labs  Lab 07/27/20 0225 07/28/20 0304 07/29/20 0804 07/30/20 0209 07/31/20 0327  NA 137 140 141 139 139  K 3.6 3.4* 3.6 3.7 4.1  CL 104 109 108 108 109  CO2 28 27 28 26 27   GLUCOSE 119* 87 90 87 90  BUN <5* <5* <5* 5* 9  CREATININE 0.64 0.57 0.66 0.64 0.75  CALCIUM 8.5* 8.3* 8.1* 8.1* 8.1*  MG  --   --   --   --  2.0    Liver Function Tests: Recent Labs  Lab 07/27/20 0225 07/28/20 0304 07/29/20 0804 07/30/20 0209 07/31/20 0327  AST 35 24 26 20 24    ALT 62* 47* 40 35 35  ALKPHOS 119 103 95 86 79  BILITOT 0.8 0.7 0.4 0.5 0.4  PROT 4.7* 4.4* 4.3* 4.4* 4.8*  ALBUMIN 2.9* 2.6* 2.6* 2.7* 2.8*   Recent Labs  Lab 07/25/20 0140  LIPASE 26    CBC: Recent Labs  Lab 07/28/20 1043 07/31/20 0327  WBC 3.9* 4.4  NEUTROABS 2.2  --   HGB 12.7 11.6*  HCT 37.7 35.3*  MCV 97.4 100.0  PLT 160 161     Recent Results (from the past 240 hour(s))  Resp Panel by RT-PCR (Flu A&B, Covid) Nasopharyngeal Swab     Status: None   Collection Time: 07/21/20  1:15 PM   Specimen: Nasopharyngeal Swab; Nasopharyngeal(NP) swabs in vial transport medium  Result Value Ref Range Status   SARS Coronavirus 2 by RT PCR NEGATIVE NEGATIVE Final    Comment: (NOTE) SARS-CoV-2 target nucleic acids are NOT DETECTED.  The SARS-CoV-2 RNA is generally detectable in upper respiratory specimens during the acute phase of infection. The lowest concentration of SARS-CoV-2 viral copies this assay can detect is 138 copies/mL. A negative result does not preclude SARS-Cov-2 infection and should not be used as the sole basis for treatment or other patient management decisions. A negative result may occur with  improper specimen collection/handling, submission of specimen other than nasopharyngeal swab, presence of viral mutation(s) within the areas targeted by this assay, and inadequate number of viral copies(<138 copies/mL). A negative result must be combined with clinical observations, patient history, and epidemiological information. The expected result is Negative.  Fact Sheet for Patients:  BloggerCourse.com  Fact Sheet for Healthcare Providers:  SeriousBroker.it  This test is no t yet approved or cleared by the [place] FDA and  has been authorized for detection and/or diagnosis of SARS-CoV-2 by FDA under an Emergency Use Authorization (EUA). This EUA will remain  in effect (meaning this test can be used)  for the duration of the COVID-19 declaration under Section 564(b)(1) of the Act, 21 U.S.C.section 360bbb-3(b)(1), unless the authorization is terminated  or revoked sooner.       Influenza A by PCR NEGATIVE NEGATIVE Final   Influenza B by PCR NEGATIVE NEGATIVE Final    Comment: (NOTE) The Xpert Xpress SARS-CoV-2/FLU/RSV plus assay is intended as an aid in the diagnosis of influenza from Nasopharyngeal swab specimens and should not be used as a sole basis for treatment. Nasal washings and aspirates are unacceptable for Xpert Xpress SARS-CoV-2/FLU/RSV testing.  Fact Sheet for Patients: BloggerCourse.com  Fact Sheet for Healthcare Providers: SeriousBroker.it  This test is not yet approved or cleared by the [place] FDA and has been authorized for detection and/or diagnosis of SARS-CoV-2 by FDA under an Emergency Use Authorization (EUA). This EUA will remain in effect (meaning this test can be used) for the duration of the COVID-19 declaration under Section 564(b)(1) of the Act, 21 U.S.C. section 360bbb-3(b)(1), unless the authorization is terminated or revoked.  Performed at [facility], [address], [place], [place] [address]      Time spent in discharge (includes decision making & examination of pt): 35 minutes  07/31/2020, 11:47 AM   Lonia Blood, MD [facility]  [phone]

## 2020-07-31 NOTE — Discharge Instructions (Signed)
Abdominal Pain, Adult Pain in the abdomen (abdominal pain) can be caused by many things. Often, abdominal pain is not serious and it gets better with no treatment or by being treated at home. However, sometimes abdominal pain is serious. Your health care provider will ask questions about your medical history and do a physical exam to try to determine the cause of your abdominal pain. Follow these instructions at home: Medicines  Take over-the-counter and prescription medicines only as told by your health care provider.  Do not take a laxative unless told by your health care provider. General instructions  Watch your condition for any changes.  Drink enough fluid to keep your urine pale yellow.  Keep all follow-up visits as told by your health care provider. This is important.   Contact a health care provider if:  Your abdominal pain changes or gets worse.  You are not hungry or you lose weight without trying.  You are constipated or have diarrhea for more than 2-3 days.  You have pain when you urinate or have a bowel movement.  Your abdominal pain wakes you up at night.  Your pain gets worse with meals, after eating, or with certain foods.  You are vomiting and cannot keep anything down.  You have a fever.  You have blood in your urine. Get help right away if:  Your pain does not go away as soon as your health care provider told you to expect.  You cannot stop vomiting.  Your pain is only in areas of the abdomen, such as the right side or the left lower portion of the abdomen. Pain on the right side could be caused by appendicitis.  You have bloody or black stools, or stools that look like tar.  You have severe pain, cramping, or bloating in your abdomen.  You have signs of dehydration, such as: ? Dark urine, very little urine, or no urine. ? Cracked lips. ? Dry mouth. ? Sunken eyes. ? Sleepiness. ? Weakness.  You have trouble breathing or chest  pain. Summary  Often, abdominal pain is not serious and it gets better with no treatment or by being treated at home. However, sometimes abdominal pain is serious.  Watch your condition for any changes.  Take over-the-counter and prescription medicines only as told by your health care provider.  Contact a health care provider if your abdominal pain changes or gets worse.  Get help right away if you have severe pain, cramping, or bloating in your abdomen. This information is not intended to replace advice given to you by your health care provider. Make sure you discuss any questions you have with your health care provider. Document Revised: 04/20/2019 Document Reviewed: 07/10/2018 Elsevier Patient Education  2021 Elsevier Inc.  

## 2020-07-31 NOTE — Progress Notes (Signed)
   07/31/20   To Whom it may concern,  Monique Floyd was admitted to [facility] on 07/21/2020 and remained under my care in the hospital through 07/31/2020.  She has been advised that she should not return to work until 08/01/2020.  Sincerely,  Lonia Blood, MD [facility]  [phone]

## 2020-08-21 NOTE — Telephone Encounter (Signed)
Patient was seen in the [facility] clinic yesterday by Delbert Phenix, has pending procedures with Dr. Edyth Gunnels

## 2020-10-14 ENCOUNTER — Inpatient Hospital Stay (HOSPITAL_COMMUNITY)
Admission: AD | Admit: 2020-10-14 | Discharge: 2020-10-22 | DRG: 885 | Disposition: A | Discharge status: Home / Self Care | Payer: BC Managed Care – PPO | Source: Intra-hospital | Attending: Emergency Medicine | Admitting: Emergency Medicine

## 2020-10-14 ENCOUNTER — Other Ambulatory Visit (HOSPITAL_COMMUNITY): Payer: Self-pay

## 2020-10-14 DIAGNOSIS — K30 Functional dyspepsia: Secondary | ICD-10-CM | POA: Diagnosis present

## 2020-10-14 DIAGNOSIS — Z87891 Personal history of nicotine dependence: Secondary | ICD-10-CM

## 2020-10-14 DIAGNOSIS — F39 Unspecified mood [affective] disorder: Secondary | ICD-10-CM | POA: Diagnosis present

## 2020-10-14 DIAGNOSIS — F10929 Alcohol use, unspecified with intoxication, unspecified: Secondary | ICD-10-CM

## 2020-10-14 DIAGNOSIS — R45851 Suicidal ideations: Secondary | ICD-10-CM | POA: Diagnosis present

## 2020-10-14 DIAGNOSIS — F419 Anxiety disorder, unspecified: Secondary | ICD-10-CM | POA: Diagnosis present

## 2020-10-14 DIAGNOSIS — F3181 Bipolar II disorder: Secondary | ICD-10-CM | POA: Diagnosis present

## 2020-10-14 DIAGNOSIS — G4700 Insomnia, unspecified: Secondary | ICD-10-CM | POA: Diagnosis present

## 2020-10-14 DIAGNOSIS — D649 Anemia, unspecified: Secondary | ICD-10-CM | POA: Diagnosis present

## 2020-10-14 DIAGNOSIS — F10129 Alcohol abuse with intoxication, unspecified: Secondary | ICD-10-CM | POA: Diagnosis present

## 2020-10-14 HISTORY — DX: Headache, unspecified: R51.9

## 2020-10-14 HISTORY — DX: Alcohol use, unspecified with intoxication, unspecified: F10.929

## 2020-10-14 MED ORDER — ALUM & MAG HYDROXIDE-SIMETH 200-200-20 MG/5ML PO SUSP: 30.0000 mL | ORAL | Status: DC | PRN

## 2020-10-14 MED ORDER — NICOTINE 21 MG/24HR TD PT24
21.0000 mg | MEDICATED_PATCH | Freq: Every day | TRANSDERMAL | Status: DC
  Administered 2020-10-15 – 2020-10-22 (×8): 21 mg via TRANSDERMAL
  Filled 2020-10-14 (×11): qty 1

## 2020-10-14 MED ORDER — HALOPERIDOL 5 MG PO TABS: 5.0000 mg | ORAL_TABLET | Freq: Four times a day (QID) | ORAL | Status: DC | PRN

## 2020-10-14 MED ORDER — ACETAMINOPHEN 325 MG PO TABS
650.0000 mg | ORAL_TABLET | Freq: Four times a day (QID) | ORAL | Status: DC | PRN
  Administered 2020-10-18 – 2020-10-21 (×3): 650 mg via ORAL
  Filled 2020-10-14 (×3): qty 2

## 2020-10-14 MED ORDER — MAGNESIUM HYDROXIDE 400 MG/5ML PO SUSP: 30.0000 mL | Freq: Every day | ORAL | Status: DC | PRN

## 2020-10-14 MED ORDER — HYDROXYZINE HCL 25 MG PO TABS
25.0000 mg | ORAL_TABLET | Freq: Three times a day (TID) | ORAL | Status: DC | PRN
  Administered 2020-10-15 – 2020-10-18 (×8): 25 mg via ORAL
  Filled 2020-10-14 (×8): qty 1

## 2020-10-14 MED ORDER — TRAZODONE HCL 50 MG PO TABS
50.0000 mg | ORAL_TABLET | Freq: Every evening | ORAL | Status: DC | PRN
  Administered 2020-10-15: 50 mg via ORAL
  Filled 2020-10-14: qty 1

## 2020-10-14 MED ORDER — BENZTROPINE MESYLATE 1 MG PO TABS: 1.0000 mg | ORAL_TABLET | Freq: Four times a day (QID) | ORAL | Status: DC | PRN

## 2020-10-15 ENCOUNTER — Encounter (HOSPITAL_COMMUNITY): Payer: Self-pay | Admitting: Registered Nurse

## 2020-10-15 ENCOUNTER — Other Ambulatory Visit: Payer: Self-pay

## 2020-10-15 DIAGNOSIS — F39 Unspecified mood [affective] disorder: Secondary | ICD-10-CM

## 2020-10-15 DIAGNOSIS — F419 Anxiety disorder, unspecified: Secondary | ICD-10-CM

## 2020-10-15 DIAGNOSIS — F3181 Bipolar II disorder: Principal | ICD-10-CM | POA: Diagnosis present

## 2020-10-15 HISTORY — DX: Bipolar II disorder: F31.81

## 2020-10-15 HISTORY — DX: Anxiety disorder, unspecified: F41.9

## 2020-10-15 HISTORY — DX: Unspecified mood (affective) disorder: F39

## 2020-10-15 LAB — LIPID PANEL
Cholesterol: 131 mg/dL (ref 0–200)
HDL: 54 mg/dL (ref >40)
LDL Cholesterol: 69 mg/dL (ref 0–99)
Total CHOL/HDL Ratio: 2.4 RATIO
Triglycerides: 42 mg/dL (ref <150)
VLDL: 8 mg/dL (ref 0–40)

## 2020-10-15 LAB — HEMOGLOBIN A1C
Hgb A1c MFr Bld: 4.8 % (ref 4.8–5.6)
Mean Plasma Glucose: 91.06 mg/dL

## 2020-10-15 LAB — TSH: TSH: 2.054 u[IU]/mL (ref 0.350–4.500)

## 2020-10-15 MED ORDER — ADULT MULTIVITAMIN W/MINERALS CH
1.0000 | ORAL_TABLET | Freq: Every day | ORAL | Status: DC
  Administered 2020-10-15: 1 via ORAL
  Filled 2020-10-15: qty 1

## 2020-10-15 MED ORDER — ADULT MULTIVITAMIN W/MINERALS CH
ORAL_TABLET | ORAL | Status: AC
  Filled 2020-10-15: qty 1

## 2020-10-15 MED ORDER — ANIMAL SHAPES WITH C & FA PO CHEW
1.0000 | CHEWABLE_TABLET | Freq: Two times a day (BID) | ORAL | Status: DC
  Administered 2020-10-15 – 2020-10-21 (×13): 1 via ORAL
  Filled 2020-10-15 (×21): qty 1

## 2020-10-15 MED ORDER — VITAMIN B-12 1000 MCG PO TABS
2500.0000 ug | ORAL_TABLET | Freq: Two times a day (BID) | ORAL | Status: DC
  Administered 2020-10-15 – 2020-10-22 (×15): 2500 ug via ORAL
  Filled 2020-10-15 (×3): qty 1
  Filled 2020-10-15: qty 2
  Filled 2020-10-15 (×16): qty 1

## 2020-10-15 MED ORDER — FERROUS GLUCONATE 324 (38 FE) MG PO TABS
324.0000 mg | ORAL_TABLET | Freq: Every day | ORAL | Status: DC
  Administered 2020-10-16 – 2020-10-21 (×6): 324 mg via ORAL
  Filled 2020-10-15 (×9): qty 1

## 2020-10-15 MED ORDER — CALCIUM CARBONATE ANTACID 500 MG PO CHEW
2.0000 | CHEWABLE_TABLET | Freq: Three times a day (TID) | ORAL | Status: DC
  Administered 2020-10-15 – 2020-10-22 (×22): 400 mg via ORAL
  Filled 2020-10-15 (×27): qty 2

## 2020-10-15 MED ORDER — LORAZEPAM 1 MG PO TABS
1.0000 mg | ORAL_TABLET | Freq: Four times a day (QID) | ORAL | Status: AC | PRN
  Filled 2020-10-15: qty 1

## 2020-10-15 MED ORDER — LOPERAMIDE HCL 2 MG PO CAPS: 2.0000 mg | ORAL_CAPSULE | ORAL | Status: AC | PRN

## 2020-10-15 MED ORDER — ONDANSETRON 4 MG PO TBDP: 4.0000 mg | ORAL_TABLET | Freq: Four times a day (QID) | ORAL | Status: AC | PRN

## 2020-10-15 MED ORDER — THIAMINE HCL 100 MG/ML IJ SOLN
100.0000 mg | Freq: Once | INTRAMUSCULAR | Status: AC
  Administered 2020-10-15: 100 mg via INTRAMUSCULAR
  Filled 2020-10-15: qty 2

## 2020-10-15 MED ORDER — THIAMINE HCL 100 MG PO TABS
100.0000 mg | ORAL_TABLET | Freq: Every day | ORAL | Status: DC
  Administered 2020-10-16 – 2020-10-22 (×7): 100 mg via ORAL
  Filled 2020-10-15 (×9): qty 1

## 2020-10-15 MED ORDER — LAMOTRIGINE 25 MG PO TABS
25.0000 mg | ORAL_TABLET | Freq: Every day | ORAL | Status: DC
  Administered 2020-10-15 – 2020-10-22 (×8): 25 mg via ORAL
  Filled 2020-10-15 (×12): qty 1

## 2020-10-15 MED ORDER — HYDROXYZINE HCL 25 MG PO TABS
25.0000 mg | ORAL_TABLET | Freq: Four times a day (QID) | ORAL | Status: DC | PRN
  Administered 2020-10-15 – 2020-10-18 (×6): 25 mg via ORAL
  Filled 2020-10-15 (×6): qty 1

## 2020-10-15 MED ORDER — FOLIC ACID 1 MG PO TABS
1.0000 mg | ORAL_TABLET | Freq: Every day | ORAL | Status: DC
  Administered 2020-10-15 – 2020-10-21 (×7): 1 mg via ORAL
  Filled 2020-10-15 (×9): qty 1

## 2020-10-15 MED ORDER — VITAMIN D3 25 MCG PO TABS
5000.0000 [IU] | ORAL_TABLET | Freq: Every day | ORAL | Status: DC
  Administered 2020-10-15 – 2020-10-22 (×8): 5000 [IU] via ORAL
  Filled 2020-10-15 (×10): qty 5

## 2020-10-15 NOTE — Plan of Care (Signed)
  Problem: Education: Goal: Emotional status will improve Outcome: Not Progressing Goal: Mental status will improve Outcome: Not Progressing   Problem: Coping: Goal: Ability to verbalize frustrations and anger appropriately will improve Outcome: Not Progressing   

## 2020-10-15 NOTE — Tx Team (Signed)
Interdisciplinary Treatment and Diagnostic Plan Update  10/15/2020 Time of Session: 9:00am  Monique Floyd MRN: [id number]  Principal Diagnosis: <principal problem not specified>  Secondary Diagnoses: Active Problems:   Acute alcohol intoxication ([place])   Current Medications:  Current Facility-Administered Medications  Medication Dose Route Frequency Provider Last Rate Last Admin   acetaminophen (TYLENOL) tablet 650 mg  650 mg Oral Q6H PRN Chalmers Guest, NP       alum & mag hydroxide-simeth (MAALOX/MYLANTA) 200-200-20 MG/5ML suspension 30 mL  30 mL Oral Q4H PRN Chalmers Guest, NP       haloperidol (HALDOL) tablet 5 mg  5 mg Oral Q6H PRN Chalmers Guest, NP       And   benztropine (COGENTIN) tablet 1 mg  1 mg Oral Q6H PRN Chalmers Guest, NP       hydrOXYzine (ATARAX/VISTARIL) tablet 25 mg  25 mg Oral TID PRN Chalmers Guest, NP   25 mg at 10/15/20 0914   hydrOXYzine (ATARAX/VISTARIL) tablet 25 mg  25 mg Oral Q6H PRN Arthor Captain, MD       loperamide (IMODIUM) capsule 2-4 mg  2-4 mg Oral PRN Arthor Captain, MD       LORazepam (ATIVAN) tablet 1 mg  1 mg Oral Q6H PRN Arthor Captain, MD       magnesium hydroxide (MILK OF MAGNESIA) suspension 30 mL  30 mL Oral Daily PRN Chalmers Guest, NP       multivitamin animal shapes (with Ca/FA) chewable tablet 1 tablet  1 tablet Oral BID Arthor Captain, MD       nicotine (NICODERM CQ - dosed in mg/24 hours) patch 21 mg  21 mg Transdermal Q0600 Chalmers Guest, NP   21 mg at 10/15/20 0626   ondansetron (ZOFRAN-ODT) disintegrating tablet 4 mg  4 mg Oral Q6H PRN Arthor Captain, MD       thiamine (B-1) injection 100 mg  100 mg Intramuscular Once Arthor Captain, MD       [START ON 10/16/2020] thiamine tablet 100 mg  100 mg Oral Daily Arthor Captain, MD       traZODone (DESYREL) tablet 50 mg  50 mg Oral QHS PRN Chalmers Guest, NP       PTA Medications: Medications Prior to Admission  Medication Sig Dispense Refill Last Dose   acetaminophen (TYLENOL)  325 MG tablet Take 2 tablets (650 mg total) by mouth every 6 (six) hours as needed for mild pain, fever or headache.      ALPRAZolam (XANAX) 0.25 MG tablet Take 1 tablet (0.25 mg total) by mouth 3 (three) times daily. 60 tablet 0    ALPRAZolam (XANAX) 0.25 MG tablet Take 1 tablet (0.25 mg total) by mouth 3 (three) times daily. 60 tablet 0    Calcium Carb-Cholecalciferol (CALCIUM PLUS VITAMIN D) 500-200 MG-UNIT TABS Take 1 tablet by mouth 3 (three) times daily.      Cholecalciferol (VITAMIN D3) 125 MCG (5000 UT) TABS Take 5,000 Units by mouth daily.      Cyanocobalamin (B-12 SUPER STRENGTH) 5000 MCG/ML LIQD Place 5,000 mcg under the tongue 2 (two) times daily.      Ferrous Gluconate-C-Folic Acid (IRON-C PO) Take 1 tablet by mouth at bedtime.      HYDROmorphone (DILAUDID) 2 MG tablet Take 0.5 tablets (1 mg total) by mouth every 4 (four) hours as needed for severe pain. 20 tablet 0    HYDROmorphone (DILAUDID) 2 MG  tablet Take 0.5 tablets (1 mg total) by mouth every 4 (four) hours as needed for severe pain 20 tablet 0    hydrOXYzine (ATARAX/VISTARIL) 25 MG tablet Take 1 tablet (25 mg total) by mouth every 8 (eight) hours as needed for anxiety. 30 tablet 0    methocarbamol (ROBAXIN) 750 MG tablet Take 1 tablet (750 mg total) by mouth every 6 (six) hours as needed for muscle spasms. 30 tablet 0    OVER THE COUNTER MEDICATION Take 1 tablet by mouth 2 (two) times daily. Celebrate-ADEK (Barriatric vitamin)       Patient Stressors: Occupational concerns Other: 16 hour work day since last Tuesday  Patient Strengths: Ability for insight Average or above average intelligence General fund of knowledge Supportive family/friends  Treatment Modalities: Medication Management, Group therapy, Case management,  1 to 1 session with clinician, Psychoeducation, Recreational therapy.   Physician Treatment Plan for Primary Diagnosis: <principal problem not specified> Long Term Goal(s):     Short Term Goals:     Medication Management: Evaluate patient's response, side effects, and tolerance of medication regimen.  Therapeutic Interventions: 1 to 1 sessions, Unit Group sessions and Medication administration.  Evaluation of Outcomes: Not Met  Physician Treatment Plan for Secondary Diagnosis: Active Problems:   Acute alcohol intoxication ([place])  Long Term Goal(s):     Short Term Goals:       Medication Management: Evaluate patient's response, side effects, and tolerance of medication regimen.  Therapeutic Interventions: 1 to 1 sessions, Unit Group sessions and Medication administration.  Evaluation of Outcomes: Not Met   RN Treatment Plan for Primary Diagnosis: <principal problem not specified> Long Term Goal(s): Knowledge of disease and therapeutic regimen to maintain health will improve  Short Term Goals: Ability to remain free from injury will improve, Ability to participate in decision making will improve, Ability to verbalize feelings will improve, Ability to disclose and discuss suicidal ideas, and Ability to identify and develop effective coping behaviors will improve  Medication Management: RN will administer medications as ordered by provider, will assess and evaluate patient's response and provide education to patient for prescribed medication. RN will report any adverse and/or side effects to prescribing provider.  Therapeutic Interventions: 1 on 1 counseling sessions, Psychoeducation, Medication administration, Evaluate responses to treatment, Monitor vital signs and CBGs as ordered, Perform/monitor CIWA, COWS, AIMS and Fall Risk screenings as ordered, Perform wound care treatments as ordered.  Evaluation of Outcomes: Not Met   LCSW Treatment Plan for Primary Diagnosis: <principal problem not specified> Long Term Goal(s): Safe transition to appropriate next level of care at discharge, Engage patient in therapeutic group addressing interpersonal concerns.  Short Term Goals:  Engage patient in aftercare planning with referrals and resources, Increase social support, Increase emotional regulation, Facilitate acceptance of mental health diagnosis and concerns, Facilitate patient progression through stages of change regarding substance use diagnoses and concerns, and Identify triggers associated with mental health/substance abuse issues  Therapeutic Interventions: Assess for all discharge needs, 1 to 1 time with Social worker, Explore available resources and support systems, Assess for adequacy in community support network, Educate family and significant other(s) on suicide prevention, Complete Psychosocial Assessment, Interpersonal group therapy.  Evaluation of Outcomes: Not Met   Progress in Treatment: Attending groups: No. Participating in groups: No. Taking medication as prescribed: Yes. Toleration medication: Yes. Family/Significant other contact made: Yes, individual(s) contacted:  If consents are provided  Patient understands diagnosis: Yes. Discussing patient identified problems/goals with staff: Yes. Medical problems stabilized or resolved: Yes.  Denies suicidal/homicidal ideation: Yes. Issues/concerns per patient self-inventory: No.   New problem(s) identified: No, Describe:  None   New Short Term/Long Term Goal(s): medication stabilization, elimination of SI thoughts, development of comprehensive mental wellness plan.   Patient Goals: "To go home"  Discharge Plan or Barriers: Patient recently admitted. CSW will continue to follow and assess for appropriate referrals and possible discharge planning.   Reason for Continuation of Hospitalization: Anxiety Depression Medication stabilization Suicidal ideation Withdrawal symptoms  Estimated Length of Stay: 3 to 5 days   Attendees: Patient: Monique Floyd  10/15/2020   Physician: Lestine Mount, DO 10/15/2020   Nursing:  10/15/2020   RN Care Manager: 10/15/2020   Social Worker: Verdis Frederickson, LCSWA   10/15/2020   Recreational Therapist:  10/15/2020   Other:  10/15/2020   Other:  10/15/2020   Other: 10/15/2020     Scribe for Treatment Team: Darleen Crocker, LCSWA 10/15/2020 9:25 AM

## 2020-10-15 NOTE — [facility] Group Notes (Signed)
[facility] LCSW Group Therapy   10/15/2020 2:56 PM  Type of Therapy and Topic:  Group Therapy:  Strengths Exploration   Participation Level: Active  Description of Group: This group allows individuals to explore their strengths, learn to use strengths in new ways to improve well-being. Strengths-based interventions involve identifying strengths, understanding how they are used, and learning new ways to apply them. Individuals will identify their strengths, and then explore their roles in different areas of life (relationships, professional life, and personal fulfillment). Individuals will think about ways in which they currently use their strengths, along with new ways they could begin using them.    Therapeutic Goals Patient will verbalize two of their strengths Patient will identify how their strengths are currently used Patient will identify two new ways to apply their strengths  Patients will create a plan to apply their strengths in their daily lives     Summary of Patient Progress:  Pt attended group, participated, and was appropriate during discussion.     Therapeutic Modalities Cognitive Behavioral Therapy Motivational Interviewing   Rilyn Upshaw, LCSWA Clinicial Social Worker [facility]  
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
## 2020-10-15 NOTE — Progress Notes (Signed)
Patient rated her day as an 8 out of 10 since she felt that she had a "better understanding" of things. Her goal for tomorrow is to go home and learn about coping better at home.

## 2020-10-15 NOTE — BHH Counselor (Signed)
Adult Comprehensive Assessment  Patient ID: Monique Floyd, female   DOB: [birth year], 40 y.o.   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, government Family Relationships: Pt reports no stressors Surveyor, quantity / Lack of resources (include bankruptcy): Pt reports no stressors Housing / Lack of housing: Pt reports living with her husband and 5 children Physical health (include injuries & life threatening diseases): Pt reports no stressors Social relationships: Pt reports few social relationships Substance abuse: Pt denies all substance use Bereavement / Loss: Pt reports mother passed in Jul 29, 2002 and father passed away in 2008/07/28  Living/Environment/Situation:  Living Arrangements: Spouse/significant other, Children Living conditions (as described by patient or guardian): "It's a really safe and nice area" Who else lives in the home?: Husband and 5 children How long has patient lived in current situation?: 20 years What is atmosphere in current home: Comfortable, Supportive  Family History:  Marital status: Married Number of Years Married: 20 What types of issues is patient dealing with in the relationship?: None Are you sexually active?: Yes What is your sexual orientation?: Heterosexual Has your sexual activity been affected by drugs, alcohol, medication, or emotional stress?: No Does patient have children?: Yes How many children?: 5 How is patient's relationship with their children?: "Mt children are 24, 18, 16, 11, and 8.  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  We all get along really well"  Childhood History:  By  whom was/is the patient raised?: Both parents Description of patient's relationship with caregiver when they were a child: "It was perfectly fine" Patient's description of current relationship with people who raised him/her: "Both of my parents have passed away now" How were you disciplined when you got in trouble as a child/adolescent?: Spankings and groundings Does patient have siblings?: Yes Number of Siblings: 2 Description of patient's current relationship with siblings: "I have an older brother and an older sister.  My brother is my best friend and I talk to my sister sometimes but we are nt as close" Did patient suffer any verbal/emotional/physical/sexual abuse as a child?: No Did patient suffer from severe childhood neglect?: No Has patient ever been sexually abused/assaulted/raped as an adolescent or adult?: No Was the patient ever a victim of a crime or a disaster?: No Witnessed domestic violence?: No Has patient been affected by domestic violence as an adult?: No  Education:  Highest grade of school patient has completed: 12th grade, Bachelor Degree in Business Currently a student?: No Learning disability?: No  Employment/Work Situation:   Employment Situation: Employed 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?: No  Financial Resources:   Financial resources: Income from employment, Income from spouse, Private insurance Does patient have a representative payee or guardian?: No  Alcohol/Substance Abuse:  What has been your use of drugs/alcohol within the last  12 months?: Pt denies all substance use If attempted suicide, did drugs/alcohol play a role in this?: No Alcohol/Substance Abuse Treatment Hx: Denies past history Has alcohol/substance abuse ever caused legal problems?: No  Social Support System:   Patient's Community Support System: Fair Museum/gallery exhibitions officer System: Husband and family Type of faith/religion: None How does patient's faith help to cope with current illness?: None  Leisure/Recreation:   Do You Have Hobbies?: Yes Leisure and Hobbies: Writing poetry and reading  Strengths/Needs:   What is the patient's perception of their strengths?: "Being a people person" Patient states they can use these personal strengths during their treatment to contribute to their recovery: "If I can help other's then I am making a difference in the world and that is important" Patient states these barriers may affect/interfere with their treatment: None Patient states these barriers may affect their return to the community: None Other important information patient would like considered in planning for their treatment: None  Discharge Plan:   Currently receiving community mental health services: No Patient states concerns and preferences for aftercare planning are: Pt is interested in therapy and psychiatry Patient states they will know when they are safe and ready for discharge when: "When I get medications and outpatient appointments" Does patient have access to transportation?: Yes (Pt reports having her own car at home) Does patient have financial barriers related to discharge medications?: No Will patient be returning to same living situation after discharge?: Yes  Summary/Recommendations:   Summary and Recommendations (to be completed by the evaluator): Monique Floyd is a 40 year old, female, who was admitted to the hospital due to Suicidal thoughts, worsening Depression, and Alcohol use.  The Pt reports no previous Suicidal thoughts  or attempts and no mental health diagnoses or concerns.  The Pt reports living wtih her husband and 5 minor children (ages 42, 8, 77, 74, and a foster child who is 74). The Pt also reports no famimy conflict or childhood abuse.  The Pt reports working long hour with few days off.  She states that she does not manage her stress but works through it instead.  The Pt reports no memory of what brought her to the hospital due to alcohol use.  The Pt reports that she does not usually drink and denies all current and previous substance use, as well as any previous substance use treatment.  The Pt reports having few social relationships stating that she "does not have time for friends due to working".  The Pt reports no other stressors at this time.  While in the hospital the Pt can benefit from crisis stabilization, medication evaluation, group therapy, psycho-education, case management, and discharge planning.  Upon discharge the Pt would like to return to her home with her husband and follow up with local outpatient providers in the [facility] area.  Aram Beecham. 10/15/2020

## 2020-10-15 NOTE — Tx Team (Signed)
Initial Treatment Plan 10/15/2020 3:02 AM [name] LDJ:[id number]    PATIENT STRESSORS: Occupational concerns Other: 16 hour work day since last Tuesday   PATIENT STRENGTHS: Ability for insight Average or above average intelligence General fund of knowledge Supportive family/friends    PATIENT IDENTIFIED PROBLEMS: Suicidal Ideation  Alcohol Abuse  Anxiety      "Pt wants help with anxiety"  "Pt wants help getting mind to slow down"         DISCHARGE CRITERIA:  Improved stabilization in mood, thinking, and/or behavior Motivation to continue treatment in a less acute level of care Reduction of life-threatening or endangering symptoms to within safe limits  PRELIMINARY DISCHARGE PLAN: Attend aftercare/continuing care group Outpatient therapy Participate in family therapy  PATIENT/FAMILY INVOLVEMENT: This treatment plan has been presented to and reviewed with the patient, Monique Floyd.  The patient and family have been given the opportunity to ask questions and make suggestions.  Marcie Bal, RN 10/15/2020, 3:02 AM
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
## 2020-10-15 NOTE — Progress Notes (Signed)
Recreation Therapy Notes  Date: 8.3.22 Time: 0930 Location: 300 Hall Dayroom  Group Topic: Stress Management   Goal Area(s) Addresses:  Patient will actively participate in stress management techniques presented during session.  Patient will successfully identify benefit of practicing stress management post d/c.   Behavioral Response: Appropriate  Intervention: Guided exercise with ambient sound and script  Activity :Guided Imagery  LRT read a script that focused on finding peace and relaxation while envisioning being on the beach at sunrise. Patients were given suggestions of ways to access scripts post d/c and encouraged to explore Youtube and other apps available on smartphones, tablets, and computers.   Education:  Stress Management, Discharge Planning.   Education Outcome: Acknowledges education  Clinical Observations/Feedback: Patient actively engaged in technique introduced, expressed no concerns.      Caroll Rancher, LRT/CTRS     Caroll Rancher A 10/15/2020 11:37 AM
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
## 2020-10-15 NOTE — Progress Notes (Signed)
   10/15/20 1815  Psychosocial Assessment  Eye Contact Fair  Facial Expression Flat  Affect Appropriate to circumstance  Speech Logical/coherent  Interaction Assertive  Appearance/Hygiene Unremarkable  Thought Process  Coherency WDL  Delusions WDL  Perception WDL  Hallucination None reported or observed  Judgment Poor  Confusion None  Danger to Self  Current suicidal ideation? Denies  Danger to Others  Danger to Others None reported or observed

## 2020-10-15 NOTE — Progress Notes (Signed)
   10/15/20 0200  Psych Admission Type (Psych Patients Only)  Admission Status Voluntary  Psychosocial Assessment  Patient Complaints Anxiety;Restlessness;Confusion  Eye Contact Fair  Facial Expression Flat  Affect Appropriate to circumstance  Speech Logical/coherent  Interaction Assertive  Motor Activity Other (Comment) (wnl)  Appearance/Hygiene Unremarkable  Behavior Characteristics Cooperative;Calm  Mood Anxious  Thought Process  Coherency WDL  Content Other (Comment) (Blaming her job & work hours)  Delusions WDL  Perception WDL  Hallucination None reported or observed  Judgment Poor  Confusion None  Danger to Self  Current suicidal ideation? Denies  Danger to Others  Danger to Others None reported or observed

## 2020-10-16 MED ORDER — TRAZODONE HCL 100 MG PO TABS
100.0000 mg | ORAL_TABLET | Freq: Every day | ORAL | Status: DC
  Administered 2020-10-16: 100 mg via ORAL
  Filled 2020-10-16 (×3): qty 1

## 2020-10-16 MED ORDER — TRAZODONE HCL 100 MG PO TABS: 100.0000 mg | ORAL_TABLET | Freq: Every evening | ORAL | Status: DC | PRN

## 2020-10-16 MED ORDER — TRAZODONE HCL 50 MG PO TABS: 50.0000 mg | ORAL_TABLET | Freq: Every evening | ORAL | Status: DC | PRN
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
## 2020-10-16 NOTE — Progress Notes (Signed)
   10/15/20 2350  Psych Admission Type (Psych Patients Only)  Admission Status Voluntary  Psychosocial Assessment  Patient Complaints Anxiety  Eye Contact Fair  Facial Expression Flat  Affect Appropriate to circumstance  Speech Logical/coherent  Interaction Assertive  Motor Activity Other (Comment) (wnl)  Appearance/Hygiene Unremarkable  Behavior Characteristics Cooperative  Mood Pleasant;Anxious  Thought Process  Coherency WDL  Content WDL  Delusions WDL  Perception WDL  Hallucination None reported or observed  Judgment Poor  Confusion None  Danger to Self  Current suicidal ideation? Denies  Danger to Others  Danger to Others None reported or observed

## 2020-10-16 NOTE — Plan of Care (Signed)
  Problem: Education: Goal: Emotional status will improve Outcome: Progressing Goal: Verbalization of understanding the information provided will improve Outcome: Progressing   Problem: Health Behavior/Discharge Planning: Goal: Compliance with treatment plan for underlying cause of condition will improve Outcome: Progressing

## 2020-10-16 NOTE — Progress Notes (Signed)
   10/16/20 2040  Psych Admission Type (Psych Patients Only)  Admission Status Voluntary  Psychosocial Assessment  Patient Complaints Insomnia  Eye Contact Fair  Facial Expression Flat  Affect Appropriate to circumstance  Speech Logical/coherent  Interaction Assertive  Motor Activity Other (Comment) (steady)  Appearance/Hygiene Unremarkable  Behavior Characteristics Cooperative;Calm  Mood Pleasant  Thought Process  Coherency WDL  Content WDL  Delusions WDL  Perception WDL  Hallucination None reported or observed  Judgment Poor  Confusion None  Danger to Self  Current suicidal ideation? Denies  Danger to Others  Danger to Others None reported or observed

## 2020-10-16 NOTE — Progress Notes (Signed)
   Pt presents with high anxiety, 8.5/10.  Pt BP read 129/103.  Administered PRN Vistaril and Trazodone per pt request per MAR.  BP recheck resulted in a BP of 110/69, with a slightly below normal HR of 57.  Main pharmacy was contacted regarding scheduled dose of 324 mg Ferrous Gluconate which was missing. Per pharmacy request, marked as "not given due to missing dose."  Pt denies SI/HI, and verbally contracts for safety.  Pt denies AVH.  Pt remains safe on unit with Q15 minute safety checks.

## 2020-10-16 NOTE — Progress Notes (Addendum)
Pt rates anxiety 8/10, depression 0/10. Pt denies SI/HI/AVH. Pt received PRN vistaril and trazodone last night and states "I toss and turn". Pt slept for 6.75 hrs last night. Pt was anxious upon interaction. Pt remains safe on the unit.

## 2020-10-16 NOTE — Progress Notes (Signed)
[facility] Group Notes:  (Nursing/MHT/Case Management/Adjunct)  Date:  10/16/2020  Time:  2015  Type of Therapy:   wrap up group  Participation Level:  Active  Participation Quality:  Appropriate, Attentive, Sharing, and Supportive  Affect:  Appropriate  Cognitive:  Appropriate  Insight:  Improving  Engagement in Group:  Engaged  Modes of Intervention:  Clarification, Education, and Support   Summary of Progress/Problems: Positive thinking and self-care were discussed.   Marcille Buffy 10/16/2020, 8:49 PM

## 2020-10-17 ENCOUNTER — Encounter (HOSPITAL_COMMUNITY): Payer: Self-pay | Admitting: Registered Nurse

## 2020-10-17 MED ORDER — QUETIAPINE FUMARATE 50 MG PO TABS
50.0000 mg | ORAL_TABLET | Freq: Every day | ORAL | Status: DC
  Administered 2020-10-17: 50 mg via ORAL
  Filled 2020-10-17 (×3): qty 1

## 2020-10-17 MED ORDER — TRAZODONE HCL 50 MG PO TABS
50.0000 mg | ORAL_TABLET | Freq: Every evening | ORAL | Status: DC | PRN
  Administered 2020-10-18: 50 mg via ORAL
  Filled 2020-10-17: qty 1

## 2020-10-17 MED ORDER — GABAPENTIN 300 MG PO CAPS: 300.0000 mg | ORAL_CAPSULE | Freq: Three times a day (TID) | ORAL | Status: DC

## 2020-10-17 MED ORDER — GABAPENTIN 100 MG PO CAPS
200.0000 mg | ORAL_CAPSULE | Freq: Three times a day (TID) | ORAL | Status: DC
  Administered 2020-10-17: 200 mg via ORAL
  Filled 2020-10-17 (×5): qty 2

## 2020-10-17 MED ORDER — GABAPENTIN 300 MG PO CAPS
300.0000 mg | ORAL_CAPSULE | Freq: Three times a day (TID) | ORAL | Status: DC
  Administered 2020-10-17 – 2020-10-22 (×16): 300 mg via ORAL
  Filled 2020-10-17 (×22): qty 1

## 2020-10-17 NOTE — [facility] Group Notes (Signed)
Due to the acuity, staffing and complex discharge plans, group was not held. Patient was provided therapeutic worksheets and asked to meet with CSW as needed.   Claramae Rigdon, LCSWA Clinicial Social Worker [facility]  

## 2020-10-17 NOTE — Progress Notes (Signed)
Adult Psychoeducational Group Note  Date:  10/17/2020 Time:  6:41 PM  Group Topic/Focus:  Healthy Communication:   The focus of this group is to discuss communication, barriers to communication, as well as healthy ways to communicate with others.  Participation Level:  Active  Participation Quality:  Appropriate  Affect:  Appropriate  Cognitive:  Appropriate  Insight: Appropriate  Engagement in Group:  Engaged  Modes of Intervention:  Discussion  Additional Comments:  Pt attended group and participated in discussion.  Anyjah Roundtree R Niajah Sipos 10/17/2020, 6:41 PM

## 2020-10-17 NOTE — Progress Notes (Addendum)
   10/17/20 2130  Psych Admission Type (Psych Patients Only)  Admission Status Voluntary  Psychosocial Assessment  Patient Complaints Anxiety  Eye Contact Fair  Facial Expression Anxious  Affect Appropriate to circumstance  Speech Logical/coherent  Interaction Assertive  Motor Activity Other (Comment) (wnl)  Appearance/Hygiene Unremarkable  Behavior Characteristics Cooperative  Mood Anxious;Pleasant  Thought Process  Coherency WDL  Content WDL  Delusions None reported or observed  Hallucination None reported or observed  Judgment Poor  Confusion None  Danger to Self  Current suicidal ideation? Denies  Danger to Others  Danger to Others None reported or observed   Pt denies SI, HI, AVH and pain. Rates anxiety 9/10. Pt's husband visited this evening and left her some things that have yet to come on the unit. "I just wanted to know when those things would be brought back. My husband wrote me a letter and I want to read it tonight." Pt assured that any new patient items will be searched and delivered to them as soon as we are able. Pt asking about additional medication if she can't sleep with the Seroquel. Pt told that Trazodone is available at least an hour after taking Seroquel if she cannot sleep.

## 2020-10-17 NOTE — Progress Notes (Signed)
Pt received PRN Vistaril 25 mg PO X2 for increased anxiety at 0805 and 0852 without effect. Pt observed to be very anxious, restless and fidgety in milieu, pacing hall and dayroom at intervals. Reports racing thoughts when assessed "I have 6 kids and I'm constantly thinking and doing things. I can't be still". Pleasant but noted with frequent demands for medications on interactions. Pt attended scheduled groups, interacts well with peers. Assigned provider made aware of pt's unrelieved anxiety with scheduled Gabapentin and PRN Vistaril. New order received to increase Gabapentin from 200 to 300 mg PO TID. First dose was given at noon. Pt made aware and was in agreement with medication changes. Q 15 minutes safety checks effective on and off unit without incident. All medications given as ordered with verbal education and effects monitored. Emotional support and encouragement offered to comply with unit routines. Pt reported relief "I feel much better now but I can still get my vistaril later right ?". Pt tolerates all meals, medications and fluids well without discomfort. Safety maintained on and off unit.

## 2020-10-17 NOTE — [facility] Group Notes (Signed)
Adult Psychoeducational Group Note  Date:  10/17/2020 Time:  9:52 AM  Group Topic/Focus:  Goals Group:   The focus of this group is to help patients establish daily goals to achieve during treatment and discuss how the patient can incorporate goal setting into their daily lives to aide in recovery. Orientation:   The focus of this group is to educate the patient on the purpose and policies of crisis stabilization and provide a format to answer questions about their admission.  The group details unit policies and expectations of patients while admitted.  Participation Level:  Active  Participation Quality:  Appropriate  Affect:  Appropriate  Cognitive:  Appropriate  Insight: Appropriate  Engagement in Group:  Engaged  Modes of Intervention:  Discussion  Additional Comments: Patient attended morning orientation/goal setting group and participated.   Etna Forquer W Kamica Florance 10/17/2020, 9:52 AM
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
## 2020-10-17 NOTE — Progress Notes (Signed)
[facility] MD Progress Note  10/17/2020 5:33 PM Monique Floyd  MRN:  [id number]  Reason for admission:  Gitel Beste is a 40 year old female with no past psychiatric history other than a history of anxiety treated through her PCP in the past who presented to [facility] ED on 10/13/2020 via law enforcement called by husband following an argument with her husband over her alcohol use and after she placed a gun to her chin and attempted to pull the trigger. BAL in the ED was 0.28 and UDS was positive for marijuana.  Objective: Medical record reviewed.  Patient's case discussed in detail with members of the treatment team.  I met with and evaluated the patient on the unit today for follow-up on 2 occasions today; once in the morning and once in the afternoon.  Patient presents as anxious and affect.  She denies depressed mood, passive wish for death, SI, AI, HI or hallucinations.  She reports she continues to have ongoing problems with her mind racing.  Patient had difficulty sleeping last night per her report although staff document that she slept 6.25 hours.  She was receptive to initiating a trial of gabapentin this morning which helped her anxiety later in the day.  Patient expressed concerns about difficulty sleeping and states she feels trazodone is not helpful and may make it more difficult for her to sleep.  We discussed discontinuing trazodone and starting Seroquel.  I discussed potential side effects of Seroquel and its indications.  Patient is in agreement with trial of Seroquel.  She denies other medication side effects.  Patient denies any symptoms of alcohol withdrawal.  She denies any physical problems.  Patient remains eager for discharge but does not object to staying in the hospital to help get her anxiety under better control.  Vital signs this morning were stable and within normal limits.  Principal Problem: Bipolar II disorder ([place]) Diagnosis: Principal Problem:   Bipolar II disorder  (HCC) Active Problems:   Acute alcohol intoxication ([place])   Anxiety disorder, unspecified   Unspecified mood (affective) disorder ([place])  Total Time spent with patient:  25 minutes  Past Psychiatric History: See admission H&P  Past Medical History:  Past Medical History:  Diagnosis Date   Abnormal Pap smear    Anxiety    Asthma    Headache    Prior pregnancy complicated by PIH, antepartum     Past Surgical History:  Procedure Laterality Date   CESAREAN SECTION     EYE SURGERY     Family History: History reviewed. No pertinent family history. Family Psychiatric  History: See admission H&P Social History:  Social History   Substance and Sexual Activity  Alcohol Use Yes   Alcohol/week: 2.0 standard drinks   Types: 1 Glasses of wine, 1 Standard drinks or equivalent per week     Social History   Substance and Sexual Activity  Drug Use Not Currently    Social History   Socioeconomic History   Marital status: Married    Spouse name: Not on file   Number of children: Not on file   Years of education: Not on file   Highest education level: Not on file  Occupational History   Not on file  Tobacco Use   Smoking status: Former    Types: Cigarettes    Quit date: 06/14/2011    Years since quitting: 9.3   Smokeless tobacco: Not on file  Vaping Use   Vaping Use: Some days  Substance and Sexual Activity  Alcohol use: Yes    Alcohol/week: 2.0 standard drinks    Types: 1 Glasses of wine, 1 Standard drinks or equivalent per week   Drug use: Not Currently   Sexual activity: Yes  Other Topics Concern   Not on file  Social History Narrative   Not on file   Social Determinants of Health   Financial Resource Strain: Not on file  Food Insecurity: Not on file  Transportation Needs: Not on file  Physical Activity: Not on file  Stress: Not on file  Social Connections: Not on file   Additional Social History:                         Sleep: Fair  Appetite:   Good  Current Medications: Current Facility-Administered Medications  Medication Dose Route Frequency Provider Last Rate Last Admin   acetaminophen (TYLENOL) tablet 650 mg  650 mg Oral Q6H PRN Chalmers Guest, NP       alum & mag hydroxide-simeth (MAALOX/MYLANTA) 200-200-20 MG/5ML suspension 30 mL  30 mL Oral Q4H PRN Chalmers Guest, NP       haloperidol (HALDOL) tablet 5 mg  5 mg Oral Q6H PRN Chalmers Guest, NP       And   benztropine (COGENTIN) tablet 1 mg  1 mg Oral Q6H PRN Chalmers Guest, NP       calcium carbonate (TUMS - dosed in mg elemental calcium) chewable tablet 400 mg of elemental calcium  2 tablet Oral TID Arthor Captain, MD   400 mg of elemental calcium at 10/17/20 1648   ferrous gluconate (FERGON) tablet 324 mg  324 mg Oral QHS Arthor Captain, MD   324 mg at 71/21/97 5883   folic acid (FOLVITE) tablet 1 mg  1 mg Oral QHS Arthor Captain, MD   1 mg at 10/16/20 2053   gabapentin (NEURONTIN) capsule 300 mg  300 mg Oral TID Lindell Spar I, NP   300 mg at 10/17/20 1648   hydrOXYzine (ATARAX/VISTARIL) tablet 25 mg  25 mg Oral TID PRN Chalmers Guest, NP   25 mg at 10/17/20 0805   hydrOXYzine (ATARAX/VISTARIL) tablet 25 mg  25 mg Oral Q6H PRN Arthor Captain, MD   25 mg at 10/17/20 1652   lamoTRIgine (LAMICTAL) tablet 25 mg  25 mg Oral Daily Arthor Captain, MD   25 mg at 10/17/20 0802   loperamide (IMODIUM) capsule 2-4 mg  2-4 mg Oral PRN Arthor Captain, MD       LORazepam (ATIVAN) tablet 1 mg  1 mg Oral Q6H PRN Arthor Captain, MD       magnesium hydroxide (MILK OF MAGNESIA) suspension 30 mL  30 mL Oral Daily PRN Chalmers Guest, NP       multivitamin animal shapes (with Ca/FA) chewable tablet 1 tablet  1 tablet Oral BID Arthor Captain, MD   1 tablet at 10/17/20 1650   nicotine (NICODERM CQ - dosed in mg/24 hours) patch 21 mg  21 mg Transdermal Q0600 Chalmers Guest, NP   21 mg at 10/17/20 0807   ondansetron (ZOFRAN-ODT) disintegrating tablet 4 mg  4 mg Oral Q6H PRN Arthor Captain, MD        QUEtiapine (SEROQUEL) tablet 50 mg  50 mg Oral QHS Arthor Captain, MD       thiamine tablet 100 mg  100 mg Oral Daily Arthor Captain, MD  100 mg at 10/17/20 0802   traZODone (DESYREL) tablet 50 mg  50 mg Oral QHS PRN Arthor Captain, MD       vitamin B-12 (CYANOCOBALAMIN) tablet 2,500 mcg  2,500 mcg Oral BID Arthor Captain, MD   2,500 mcg at 10/17/20 1649   Vitamin D3 (Vitamin D) tablet 5,000 Units  5,000 Units Oral Daily Arthor Captain, MD   5,000 Units at 10/17/20 9509    Lab Results:  No results found for this or any previous visit (from the past 48 hour(s)).   Blood Alcohol level:  No results found for: [facility]  Metabolic Disorder Labs: Lab Results  Component Value Date   HGBA1C 4.8 10/15/2020   MPG 91.06 10/15/2020   No results found for: PROLACTIN Lab Results  Component Value Date   CHOL 131 10/15/2020   TRIG 42 10/15/2020   HDL 54 10/15/2020   CHOLHDL 2.4 10/15/2020   VLDL 8 10/15/2020   LDLCALC 69 10/15/2020    Physical Findings: AIMS:  , ,  ,  ,    CIWA:  CIWA-Ar Total: 1 COWS:     Musculoskeletal: Strength & Muscle Tone: within normal limits Gait & Station: normal Patient leans: N/A  Psychiatric Specialty Exam:  Presentation  General Appearance: Appropriate for Environment; Neat  Eye Contact:Good  Speech:Clear and Coherent; Normal Rate  Speech Volume:Normal  Handedness: No data recorded  Mood and Affect  Mood:Anxious  Affect:Congruent   Thought Process  Thought Processes:Coherent; Disorganized  Descriptions of Associations:Intact  Orientation:Full (Time, Place and Person)  Thought Content:Logical; WDL  History of Schizophrenia/Schizoaffective disorder:No data recorded Duration of Psychotic Symptoms:No data recorded Hallucinations:Hallucinations: None  Ideas of Reference:None  Suicidal Thoughts:Suicidal Thoughts: No  Homicidal Thoughts:Homicidal Thoughts: No   Sensorium  Memory:Immediate Good; Recent  Fair  Judgment:Fair  Insight:Fair   Executive Functions  Concentration:Good  Attention Span:Good  [place] of Knowledge:Good  Language:Good   Psychomotor Activity  Psychomotor Activity:Psychomotor Activity: Normal   Assets  Assets:Communication Skills; Desire for Improvement; Housing; Resilience; Social Support; Vocational/Educational   Sleep  Sleep:Sleep: Good Number of Hours of Sleep: 6.25    Physical Exam: Physical Exam Vitals and nursing note reviewed.  Constitutional:      General: She is not in acute distress.    Appearance: She is not diaphoretic.  HENT:     Head: Normocephalic and atraumatic.  Cardiovascular:     Rate and Rhythm: Normal rate.  Pulmonary:     Effort: Pulmonary effort is normal.  Neurological:     General: No focal deficit present.     Mental Status: She is alert and oriented to person, place, and time.   Review of Systems  Constitutional:  Negative for diaphoresis and fever.  HENT: Negative.    Respiratory:  Negative for cough and shortness of breath.   Cardiovascular:  Negative for chest pain and palpitations.  Gastrointestinal:  Negative for constipation, diarrhea, nausea and vomiting.  Musculoskeletal: Negative.   Skin:  Negative for rash.  Neurological:  Negative for dizziness, tremors, seizures and headaches.  Psychiatric/Behavioral:  Negative for depression, hallucinations and suicidal ideas. The patient is nervous/anxious and has insomnia.   All other systems reviewed and are negative. Blood pressure 134/88, pulse 75, temperature 98.3 F (36.8 C), resp. rate 16, height _0  (1.6 m), weight 71.7 kg, SpO2 100 %. Body mass index is 27.99 kg/m.   Treatment Plan Summary: Daily contact with patient to assess and evaluate symptoms and progress in treatment and  Medication management  Alcohol use disorder -Patient is not displaying signs or symptoms of alcohol withdrawal -Patient continues on CIWA protocol with PRN  lorazepam for CIWA score >10. -I have advised her to participate in substance abuse treatment program after discharge if she is unable to refrain from alcohol or substance use on her own. -Start gabapentin 300 mg 3 times daily  Mood disorder -Continue lamotrigine 25 mg daily for mood stabilization -Start Seroquel 50 mg at bedtime  Anxiety -Start gabapentin 300 mg 3 times daily -Continue hydroxyzine 25 mg Q6H PRN  Insomnia -Start Seroquel 50 mg at bedtime -Discontinue standing dose trazodone 100 mg QHS -Continue trazodone 50 mg nightly PRN if standing dose trazodone ineffective for sleep after 1 hour  Discharge planning in progress  Arthor Captain, MD 10/17/2020, 5:33 PM

## 2020-10-17 NOTE — B[facility] Group Notes (Signed)
Pt attended and participated in AA meeting this evening.  

## 2020-10-18 MED ORDER — HYDROXYZINE HCL 50 MG PO TABS: 50.0000 mg | ORAL_TABLET | Freq: Four times a day (QID) | ORAL | Status: AC | PRN

## 2020-10-18 MED ORDER — QUETIAPINE FUMARATE 25 MG PO TABS
25.0000 mg | ORAL_TABLET | Freq: Two times a day (BID) | ORAL | Status: DC
  Administered 2020-10-18 – 2020-10-19 (×3): 25 mg via ORAL
  Filled 2020-10-18 (×6): qty 1

## 2020-10-18 MED ORDER — LORAZEPAM 1 MG PO TABS
1.0000 mg | ORAL_TABLET | Freq: Four times a day (QID) | ORAL | Status: DC | PRN
  Administered 2020-10-18 (×2): 1 mg via ORAL
  Filled 2020-10-18: qty 1

## 2020-10-18 MED ORDER — QUETIAPINE FUMARATE 100 MG PO TABS
100.0000 mg | ORAL_TABLET | Freq: Every day | ORAL | Status: DC
  Administered 2020-10-18 – 2020-10-19 (×2): 100 mg via ORAL
  Filled 2020-10-18 (×3): qty 1

## 2020-10-18 MED ORDER — LOPERAMIDE HCL 2 MG PO CAPS: 2.0000 mg | ORAL_CAPSULE | ORAL | Status: AC | PRN

## 2020-10-18 MED ORDER — ONDANSETRON 4 MG PO TBDP: 4.0000 mg | ORAL_TABLET | Freq: Four times a day (QID) | ORAL | Status: AC | PRN

## 2020-10-18 MED ORDER — TRAZODONE HCL 50 MG PO TABS
50.0000 mg | ORAL_TABLET | Freq: Every evening | ORAL | Status: DC | PRN
  Administered 2020-10-19: 50 mg via ORAL
  Filled 2020-10-18: qty 1

## 2020-10-18 NOTE — BHH Counselor (Addendum)
LCSW Group Therapy Note  Date/Time:  10/18/2020   10:00AM-11:00AM  Type of Therapy and Topic:  Group Therapy:  Fears and Unhealthy/Healthy Coping Skills  Participation Level:  Active   Description of Group:  The focus of this group was to discuss some of the prevalent fears that patients experience, and to identify the commonalities among group members.  A fun exercise was used to initiate the discussion, followed by writing on the white board a group-generated list of unhealthy coping and healthy coping techniques to deal with each fear.    Therapeutic Goals: Patient will be able to distinguish between healthy and unhealthy coping skills Patient will identify and describe 3 fears they experience Patient will identify one positive coping strategy for each fear they experience Patient will respond empathetically to peers' statements regarding fears they experience  Summary of Patient Progress:  The patient was appropriate in her sharing and was attentive throughout group.  Therapeutic Modalities Cognitive Behavioral Therapy Motivational Interviewing  Ambrose Mantle, LCSW

## 2020-10-18 NOTE — Progress Notes (Signed)
Adult Psychoeducational Group Note  Date:  10/18/2020 Time:  10:25 AM  Group Topic/Focus:  Goals Group:   The focus of this group is to help patients establish daily goals to achieve during treatment and discuss how the patient can incorporate goal setting into their daily lives to aide in recovery.  Participation Level:  Active  Participation Quality:  Appropriate and Attentive  Affect:  Appropriate  Cognitive:  Alert and Appropriate  Insight: Appropriate and Good  Engagement in Group:  Engaged  Modes of Intervention:  Discussion  Additional Comments:  Pt attended group and participated in discussion.   Kathaleya Mcduffee R Ashby Moskal 10/18/2020, 10:25 AM

## 2020-10-18 NOTE — Progress Notes (Signed)
The focus of this group is to help patients review their daily goal of treatment and discuss progress on daily workbooks. Pt attended the evening group session and responded to all discussion prompts from the Writer. Pt shared that today was a good day on the unit, the highlight of which was looking forward to her coming discharge.  Pt discussed how her medications were helping her anxiety and also how much she was looking forward to going home. "I really miss my kids. I can't wait to hug them first thing." Pt cited medication management and discharging home as her primary goals for the coming week.  Monique Floyd rated her day a 6 out of 10 and her affect was appropriate.

## 2020-10-18 NOTE — Progress Notes (Addendum)
   10/18/20 2100  Psych Admission Type (Psych Patients Only)  Admission Status Voluntary  Psychosocial Assessment  Patient Complaints Anxiety  Eye Contact Fair  Facial Expression Anxious;Flat  Affect Appropriate to circumstance  Speech Logical/coherent  Interaction Assertive  Motor Activity Other (Comment) (wnl)  Appearance/Hygiene Unremarkable  Behavior Characteristics Cooperative;Anxious  Mood Anxious;Preoccupied (preoccupied with her anxiety)  Thought Process  Coherency WDL  Content WDL  Delusions None reported or observed  Perception WDL  Hallucination None reported or observed  Judgment Poor  Confusion None  Danger to Self  Current suicidal ideation? Denies  Danger to Others  Danger to Others None reported or observed   Pt observed sitting in the hallway outside the dayroom with peers. Pt denies SI, HI, AVH. Pt rates pain as 6/10 headache. Pt rates anxiety 9/10. Says she takes Xanax at home for anxiety. When asked the dosage, pt replied, "I take a lot of it." Pt says her anxiety level is always elevated. 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 I come home, get the kids ready, sleep for a few hours and then get up and do it all again." Pt says her job kept her busy and distracted so she didn't think about her anxiety. Pt afraid she will hyperventilate d/t her anxiety level. "The doctor was supposed to give me a higher dosage of Lorazepam." Pt told that it is at 1 mg for now and every 6 hours if CIWA score warrants it. "I was shaking earlier today. I guess now that I'm here and not working I have no distractions and I'm anxious all the time." Pt also asked about Zoloft being added to her medication profile. Pt encouraged to speak to provider in the morning about that.

## 2020-10-18 NOTE — Progress Notes (Signed)
   10/18/20 0800  Psych Admission Type (Psych Patients Only)  Admission Status Voluntary  Psychosocial Assessment  Patient Complaints Anxiety  Eye Contact Fair  Facial Expression Flat  Affect Appropriate to circumstance  Speech Logical/coherent  Interaction Assertive  Motor Activity Other (Comment) (steady)  Appearance/Hygiene Unremarkable  Behavior Characteristics Cooperative  Mood Anxious  Thought Process  Coherency WDL  Content WDL  Delusions None reported or observed  Perception WDL  Hallucination None reported or observed  Judgment Poor  Confusion None  Danger to Self  Current suicidal ideation? Denies  Danger to Others  Danger to Others None reported or observed      COVID-19 Daily Checkoff  Have you had a fever (temp > 37.80C/100F)  in the past 24 hours?  No  If you have had runny nose, nasal congestion, sneezing in the past 24 hours, has it worsened? No  COVID-19 EXPOSURE  Have you traveled outside the state in the past 14 days? No  Have you been in contact with someone with a confirmed diagnosis of COVID-19 or PUI in the past 14 days without wearing appropriate PPE? No  Have you been living in the same home as a person with confirmed diagnosis of COVID-19 or a PUI (household contact)? No  Have you been diagnosed with COVID-19? No

## 2020-10-18 NOTE — Progress Notes (Signed)
Pt seen at nurse's station. Couldn't sleep and anxious. Given trazodone and vistaril per MAR.

## 2020-10-18 NOTE — Progress Notes (Signed)
[facility] MD Progress Note  10/18/2020 6:20 AM Monique Floyd  MRN:  [id number]  Reason for admission:  Monique Floyd is a 40 year old female with no past psychiatric history other than a history of anxiety treated through her PCP in the past who presented to [facility] ED on 10/13/2020 via law enforcement called by husband following an argument with her husband over her alcohol use and after she placed a gun to her chin and attempted to pull the trigger. BAL in the ED was 0.28 and UDS was positive for marijuana.  Chart Review from last 24 hours:  The patient's chart was reviewed and nursing notes were reviewed. The patient's case was discussed in multidisciplinary team meeting. Per nursing, patient did not sleep due to anxiety and required Trazodone and Vistaril overnight. She additionally required Vistaril 4 times during the day for anxiety. She had no acute safety concerns or behavioral issues noted and attended groups. Per [facility] she was compliant with scheduled medications.   Information Obtained Today During Patient Interview: The patient was seen and evaluated on the unit. On assessment today the patient reports that she did not sleep well last night and continues to have anxiety and racing thoughts. She denies SI, HI, AVH, paranoia, or ideas of reference. She reports good appetite and voices no physical complaints. She states she has no recall of events that led to admission and is hopeful to get home soon to her family. She denies medication side-effects. She does not endorse cravings or signs of alcohol withdrawal.   Principal Problem: Bipolar II disorder (HCC) Diagnosis: Principal Problem:   Bipolar II disorder (HCC) Active Problems:   Acute alcohol intoxication (HCC)   Anxiety disorder, unspecified   Unspecified mood (affective) disorder (HCC)  Total Time Spent in Direct Patient Care:  I personally spent 25 minutes on the unit in direct patient care. The direct patient care time included  face-to-face time with the patient, reviewing the patient's chart, communicating with other professionals, and coordinating care. Greater than 50% of this time was spent in counseling or coordinating care with the patient regarding goals of hospitalization, psycho-education, and discharge planning needs.  Past Psychiatric History: See admission H&P  Past Medical History:  Past Medical History:  Diagnosis Date   Abnormal Pap smear    Anxiety    Asthma    Headache    Prior pregnancy complicated by PIH, antepartum     Past Surgical History:  Procedure Laterality Date   CESAREAN SECTION     EYE SURGERY     Family History: see H&P  Family Psychiatric  History: See admission H&P  Social History:  Social History   Substance and Sexual Activity  Alcohol Use Yes   Alcohol/week: 2.0 standard drinks   Types: 1 Glasses of wine, 1 Standard drinks or equivalent per week     Social History   Substance and Sexual Activity  Drug Use Not Currently    Social History   Socioeconomic History   Marital status: Married    Spouse name: Not on file   Number of children: Not on file   Years of education: Not on file   Highest education level: Not on file  Occupational History   Not on file  Tobacco Use   Smoking status: Former    Types: Cigarettes    Quit date: 06/14/2011    Years since quitting: 9.3   Smokeless tobacco: Not on file  Vaping Use   Vaping Use: Some days  Substance and  Sexual Activity   Alcohol use: Yes    Alcohol/week: 2.0 standard drinks    Types: 1 Glasses of wine, 1 Standard drinks or equivalent per week   Drug use: Not Currently   Sexual activity: Yes  Other Topics Concern   Not on file  Social History Narrative   Not on file   Social Determinants of Health   Financial Resource Strain: Not on file  Food Insecurity: Not on file  Transportation Needs: Not on file  Physical Activity: Not on file  Stress: Not on file  Social Connections: Not on file     Sleep: Reported as poor  Appetite:  Good  Current Medications: Current Facility-Administered Medications  Medication Dose Route Frequency Provider Last Rate Last Admin   acetaminophen (TYLENOL) tablet 650 mg  650 mg Oral Q6H PRN Novella Olive, NP       alum & mag hydroxide-simeth (MAALOX/MYLANTA) 200-200-20 MG/5ML suspension 30 mL  30 mL Oral Q4H PRN Novella Olive, NP       calcium carbonate (TUMS - dosed in mg elemental calcium) chewable tablet 400 mg of elemental calcium  2 tablet Oral TID Claudie Revering, MD   400 mg of elemental calcium at 10/17/20 1648   ferrous gluconate (FERGON) tablet 324 mg  324 mg Oral QHS Claudie Revering, MD   324 mg at 10/17/20 2138   folic acid (FOLVITE) tablet 1 mg  1 mg Oral QHS Claudie Revering, MD   1 mg at 10/17/20 2138   gabapentin (NEURONTIN) capsule 300 mg  300 mg Oral TID Armandina Stammer I, NP   300 mg at 10/17/20 1648   hydrOXYzine (ATARAX/VISTARIL) tablet 25 mg  25 mg Oral TID PRN Novella Olive, NP   25 mg at 10/18/20 0617   hydrOXYzine (ATARAX/VISTARIL) tablet 25 mg  25 mg Oral Q6H PRN Claudie Revering, MD   25 mg at 10/18/20 0017   lamoTRIgine (LAMICTAL) tablet 25 mg  25 mg Oral Daily Claudie Revering, MD   25 mg at 10/17/20 0802   loperamide (IMODIUM) capsule 2-4 mg  2-4 mg Oral PRN Claudie Revering, MD       LORazepam (ATIVAN) tablet 1 mg  1 mg Oral Q6H PRN Claudie Revering, MD       magnesium hydroxide (MILK OF MAGNESIA) suspension 30 mL  30 mL Oral Daily PRN Novella Olive, NP       multivitamin animal shapes (with Ca/FA) chewable tablet 1 tablet  1 tablet Oral BID Claudie Revering, MD   1 tablet at 10/17/20 1650   nicotine (NICODERM CQ - dosed in mg/24 hours) patch 21 mg  21 mg Transdermal Q0600 Novella Olive, NP   21 mg at 10/18/20 0617   ondansetron (ZOFRAN-ODT) disintegrating tablet 4 mg  4 mg Oral Q6H PRN Claudie Revering, MD       QUEtiapine (SEROQUEL) tablet 50 mg  50 mg Oral QHS Claudie Revering, MD   50 mg at 10/17/20 2138   thiamine  tablet 100 mg  100 mg Oral Daily Claudie Revering, MD   100 mg at 10/17/20 0802   traZODone (DESYREL) tablet 50 mg  50 mg Oral QHS PRN Claudie Revering, MD   50 mg at 10/18/20 0017   vitamin B-12 (CYANOCOBALAMIN) tablet 2,500 mcg  2,500 mcg Oral BID Claudie Revering, MD   2,500 mcg at 10/17/20 1649   Vitamin D3 (Vitamin D) tablet 5,000  Units  5,000 Units Oral Daily Claudie Revering, MD   5,000 Units at 10/17/20 8295    Lab Results:  No results found for this or any previous visit (from the past 48 hour(s)).   Blood Alcohol level:  No results found for: [facility]  Metabolic Disorder Labs: Lab Results  Component Value Date   HGBA1C 4.8 10/15/2020   MPG 91.06 10/15/2020   No results found for: PROLACTIN Lab Results  Component Value Date   CHOL 131 10/15/2020   TRIG 42 10/15/2020   HDL 54 10/15/2020   CHOLHDL 2.4 10/15/2020   VLDL 8 10/15/2020   LDLCALC 69 10/15/2020    Physical Findings: AIMS:  , ,  ,  ,    CIWA:  CIWA-Ar Total: 3 COWS:     Musculoskeletal: Strength & Muscle Tone: within normal limits Gait & Station: normal Patient leans: N/A  Psychiatric Specialty Exam: Physical Exam Vitals and nursing note reviewed.  HENT:     Head: Normocephalic and atraumatic.  Pulmonary:     Effort: Pulmonary effort is normal.  Neurological:     General: No focal deficit present.     Mental Status: She is alert.    Review of Systems  Respiratory:  Negative for shortness of breath.   Cardiovascular:  Negative for chest pain.  Gastrointestinal:  Negative for constipation, diarrhea, nausea and vomiting.  Psychiatric/Behavioral:  Negative for hallucinations and suicidal ideas. The patient is nervous/anxious.   All other systems reviewed and are negative.  Blood pressure 116/70, pulse 91, temperature 97.9 F (36.6 C), temperature source Oral, resp. rate 16, height 5\' 3"  (1.6 m), weight 71.7 kg, SpO2 98 %.Body mass index is 27.99 kg/m.  General Appearance:  casually dressed, adequate  hygiene  Eye Contact:  Good  Speech:  Clear and Coherent and Normal Rate  Volume:  Normal  Mood:  Anxious  Affect:  Congruent  Thought Process:  Coherent, Goal Directed, and Linear  Orientation:  Full (Time, Place, and Person)  Thought Content:  Logical and denies AVH, paranoia and no delusions or acute psychosis noted on exam  Suicidal Thoughts:  No  Homicidal Thoughts:  No  Memory:  Recent;   Good  Judgement:  Fair  Insight:  Fair  Psychomotor Activity:  Normal  Concentration:  Concentration: Fair and Attention Span: Fair  Recall:  Fair  Fund of Knowledge:  Good  Language:  Good  Akathisia:  Negative  Assets:  Communication Skills Desire for Improvement Housing Resilience Social Support  ADL's:  Intact  Cognition:  WNL  Sleep:  Number of Hours: 5   Treatment Plan Summary: Daily contact with patient to assess and evaluate symptoms and progress in treatment and Medication management  Alcohol use disorder -Patient is not displaying signs or symptoms of alcohol withdrawal (recent CIWA scores 1,3,2,3) -Patient continues on CIWA protocol with PRN lorazepam for CIWA score >10. -I have advised her to participate in substance abuse treatment program after discharge if she is unable to refrain from alcohol or substance use on her own. -Continue gabapentin 300 mg 3 times daily  Mood disorder -Continue lamotrigine 25 mg daily for mood stabilization -Start Seroquel 25mg  bid for anxiety/mood and increase to 100 mg at bedtime for mood and sleep  Anxiety -Continue gabapentin 300 mg 3 times daily - Start Seroquel 25mg  bid and increase to 100mg  qhs -Increase hydroxyzine to 50 mg Q6H PRN  Insomnia -Increase Seroquel 100 mg at bedtime -Continue trazodone 50 mg nightly PRN insomnia Discharge  planning in progress  Comer LocketAmy E Dhruti Ghuman, MD 10/18/2020, 6:20 AM

## 2020-10-19 MED ORDER — HYDROXYZINE HCL 50 MG PO TABS
50.0000 mg | ORAL_TABLET | Freq: Four times a day (QID) | ORAL | Status: DC | PRN
  Administered 2020-10-19 – 2020-10-20 (×4): 50 mg via ORAL
  Filled 2020-10-19 (×4): qty 1

## 2020-10-19 MED ORDER — QUETIAPINE FUMARATE 50 MG PO TABS
50.0000 mg | ORAL_TABLET | Freq: Two times a day (BID) | ORAL | Status: DC
  Administered 2020-10-19 – 2020-10-20 (×2): 50 mg via ORAL
  Filled 2020-10-19 (×5): qty 1

## 2020-10-19 MED ORDER — LORAZEPAM 1 MG PO TABS: 1.0000 mg | ORAL_TABLET | Freq: Four times a day (QID) | ORAL | Status: DC | PRN

## 2020-10-19 NOTE — Progress Notes (Signed)
[facility] MD Progress [facility]  10/19/2020 7:06 AM Monique RackStephanie Floyd  MRN:  [id number]  Reason for admission:  Monique RackStephanie Floyd is a 40 year old female with no past psychiatric history other than a history of anxiety treated through her PCP in the past who [facility] on 10/13/2020 via law enforcement called by husband following an argument with her husband over her alcohol use and after she placed a gun to her chin and attempted to pull the trigger. BAL in the ED was 0.28 and UDS was positive for marijuana.  Chart Review from last 24 hours:  The patient's chart was reviewed and nursing notes were reviewed. The patient's case was discussed in multidisciplinary team meeting. Per nursing, patient c/o ongoing anxiety. She had no acute safety concerns or behavioral issues noted and attended groups. Per MAR she was compliant with scheduled medications and received Vistaril X1 yesterday for anxiety and Ativan X2 for CIWA scores. She also received Trazodone X1 for sleep.  Information Obtained Today During Patient Interview: The patient was seen and evaluated on the unit. Today she is reporting increased anxiety and questions about the start of an antidepressant or scheduled benzodiazepine for her residual anxiety symptoms. She minimizes her drinking prior to admission and states she does not have a problem with alcohol and only drinks on average a glass of wine 1-2 times per week when at home. She states she had Xanax (unknown dose) prescribed and was taking this 1-2 times daily prior to admission. I explained that given her BAL on admission and her recent use of benzodiazepines that she is on a CIWA protocol and will only get Ativan if she meets scoring criteria. She asks to increase her daytime Seroquel for anxiety, and I advised we could titrate up to 50mg  bid and continue 100mg  qhs. She admits that she slept better last night with the increased Seroquel dose. I advised that there is concern that she has  bipolar II symptoms and that use of an antidepressant puts her at risk for mood escalation. She denies current racing thoughts, grandiosity, or hypomanic symptoms. She denies SI, HI, AVH or paranoia. I reminded her she has Vistaril 50mg  q6 hours PRN anxiety and has just had Neurontin dose titrated up for anxiety as well. Supportive therapy provided and she was encouraged to work on improved coping skills. She reports stable appetite and voices no physical complaints.   Principal Problem: Bipolar II disorder (HCC) Diagnosis: Principal Problem:   Bipolar II disorder (HCC) Active Problems:   Acute alcohol intoxication (HCC)   Anxiety disorder, unspecified   Unspecified mood (affective) disorder (HCC)  Total Time Spent in Direct Patient Care:  I personally spent 25 minutes on the unit in direct patient care. The direct patient care time included face-to-face time with the patient, reviewing the patient's chart, communicating with other professionals, and coordinating care. Greater than 50% of this time was spent in counseling or coordinating care with the patient regarding goals of hospitalization, psycho-education, and discharge planning needs.  Past Psychiatric History: See admission H&P  Past Medical History:  Past Medical History:  Diagnosis Date   Abnormal Pap smear    Anxiety    Asthma    Headache    Prior pregnancy complicated by PIH, antepartum     Past Surgical History:  Procedure Laterality Date   CESAREAN SECTION     EYE SURGERY     Family History: see H&P  Family Psychiatric  History: See admission H&P  Social History:  Social History   Substance and Sexual Activity  Alcohol Use Yes   Alcohol/week: 2.0 standard drinks   Types: 1 Glasses of wine, 1 Standard drinks or equivalent per week     Social History   Substance and Sexual Activity  Drug Use Not Currently    Social History   Socioeconomic History   Marital status: Married    Spouse name: Not on file    Number of children: Not on file   Years of education: Not on file   Highest education level: Not on file  Occupational History   Not on file  Tobacco Use   Smoking status: Former    Types: Cigarettes    Quit date: 06/14/2011    Years since quitting: 9.3   Smokeless tobacco: Not on file  Vaping Use   Vaping Use: Some days  Substance and Sexual Activity   Alcohol use: Yes    Alcohol/week: 2.0 standard drinks    Types: 1 Glasses of wine, 1 Standard drinks or equivalent per week   Drug use: Not Currently   Sexual activity: Yes  Other Topics Concern   Not on file  Social History Narrative   Not on file   Social Determinants of Health   Financial Resource Strain: Not on file  Food Insecurity: Not on file  Transportation Needs: Not on file  Physical Activity: Not on file  Stress: Not on file  Social Connections: Not on file    Sleep: Improving  Appetite:  Good  Current Medications: Current Facility-Administered Medications  Medication Dose Route Frequency Provider Last Rate Last Admin   acetaminophen (TYLENOL) tablet 650 mg  650 mg Oral Q6H PRN Novella Olive, NP   650 mg at 10/18/20 2102   alum & mag hydroxide-simeth (MAALOX/MYLANTA) 200-200-20 MG/5ML suspension 30 mL  30 mL Oral Q4H PRN Novella Olive, NP       calcium carbonate (TUMS - dosed in mg elemental calcium) chewable tablet 400 mg of elemental calcium  2 tablet Oral TID Claudie Revering, MD   400 mg of elemental calcium at 10/18/20 1818   ferrous gluconate (FERGON) tablet 324 mg  324 mg Oral QHS Claudie Revering, MD   324 mg at 10/18/20 2102   folic acid (FOLVITE) tablet 1 mg  1 mg Oral QHS Claudie Revering, MD   1 mg at 10/18/20 2102   gabapentin (NEURONTIN) capsule 300 mg  300 mg Oral TID Armandina Stammer I, NP   300 mg at 10/18/20 1819   lamoTRIgine (LAMICTAL) tablet 25 mg  25 mg Oral Daily Claudie Revering, MD   25 mg at 10/18/20 0932   loperamide (IMODIUM) capsule 2-4 mg  2-4 mg Oral PRN Comer Locket, MD        LORazepam (ATIVAN) tablet 1 mg  1 mg Oral Q6H PRN Bartholomew Crews E, MD   1 mg at 10/18/20 1823   magnesium hydroxide (MILK OF MAGNESIA) suspension 30 mL  30 mL Oral Daily PRN Novella Olive, NP       multivitamin animal shapes (with Ca/FA) chewable tablet 1 tablet  1 tablet Oral BID Claudie Revering, MD   1 tablet at 10/18/20 1819   nicotine (NICODERM CQ - dosed in mg/24 hours) patch 21 mg  21 mg Transdermal Q0600 Novella Olive, NP   21 mg at 10/18/20 0617   ondansetron (ZOFRAN-ODT) disintegrating tablet 4 mg  4 mg Oral Q6H PRN Comer Locket, MD  QUEtiapine (SEROQUEL) tablet 100 mg  100 mg Oral QHS Mason Jim, Preciosa Bundrick E, MD   100 mg at 10/18/20 2102   QUEtiapine (SEROQUEL) tablet 25 mg  25 mg Oral BID Comer Locket, MD   25 mg at 10/18/20 1819   thiamine tablet 100 mg  100 mg Oral Daily Claudie Revering, MD   100 mg at 10/18/20 9628   traZODone (DESYREL) tablet 50 mg  50 mg Oral QHS PRN Comer Locket, MD       vitamin B-12 (CYANOCOBALAMIN) tablet 2,500 mcg  2,500 mcg Oral BID Claudie Revering, MD   2,500 mcg at 10/18/20 1819   Vitamin D3 (Vitamin D) tablet 5,000 Units  5,000 Units Oral Daily Claudie Revering, MD   5,000 Units at 10/18/20 3662    Lab Results:  No results found for this or any previous visit (from the past 48 hour(s)).   Blood Alcohol level:  No results found for: [facility]  Metabolic Disorder Labs: Lab Results  Component Value Date   HGBA1C 4.8 10/15/2020   MPG 91.06 10/15/2020   No results found for: PROLACTIN Lab Results  Component Value Date   CHOL 131 10/15/2020   TRIG 42 10/15/2020   HDL 54 10/15/2020   CHOLHDL 2.4 10/15/2020   VLDL 8 10/15/2020   LDLCALC 69 10/15/2020    Physical Findings: AIMS:  , ,  ,  ,    CIWA:  CIWA-Ar Total: 3     Musculoskeletal: Strength & Muscle Tone: within normal limits Gait & Station: normal Patient leans: N/A  Psychiatric Specialty Exam: Physical Exam Vitals and nursing note reviewed.  HENT:     Head: Normocephalic  and atraumatic.  Pulmonary:     Effort: Pulmonary effort is normal.  Neurological:     General: No focal deficit present.     Mental Status: She is alert.    Review of Systems  Respiratory:  Negative for shortness of breath.   Cardiovascular:  Negative for chest pain.  Gastrointestinal:  Negative for constipation, diarrhea, nausea and vomiting.  Psychiatric/Behavioral:  Negative for hallucinations and suicidal ideas. The patient is nervous/anxious.    Blood pressure 123/60, pulse (!) 113, temperature 97.8 F (36.6 C), temperature source Oral, resp. rate 16, height 5\' 3"  (1.6 m), weight 71.7 kg, SpO2 98 %.Body mass index is 27.99 kg/m.  General Appearance:  casually dressed, adequate hygiene  Eye Contact:  Good  Speech:  Clear and Coherent and Normal Rate  Volume:  Normal  Mood:  Anxious  Affect:  Congruent  Thought Process:  Coherent, Goal Directed, and Linear; ruminative about anxiety symptoms  Orientation:  Full (Time, Place, and Person)  Thought Content:  Logical and denies AVH, paranoia and no delusions or acute psychosis noted on exam  Suicidal Thoughts:  No  Homicidal Thoughts:  No  Memory:  Recent;   Good  Judgement:  Fair  Insight:  Fair  Psychomotor Activity:  Normal  Concentration:  Concentration: Fair and Attention Span: Fair  Recall:  of Knowledge:  Good  Language:  Good  Akathisia:  Negative  Assets:  Communication Skills Desire for Improvement Housing Resilience Social Support  ADL's:  Intact  Cognition:  WNL  Sleep:  Number of Hours: 6.75   Treatment Plan Summary: Daily contact with patient to assess and evaluate symptoms and progress in treatment and Medication management  R/o Alcohol use disorder -Patient is not displaying signs or symptoms of alcohol withdrawal (recent CIWA scores  12, 10, 6, 3) -Patient continues on CIWA protocol with PRN lorazepam 1mg  and will change scoring to >12 for Ativan dose since she appears to be seeking  benzodiazepines for anxiety and has no other acute withdrawal symptoms presently -She was encouraged to consider [facility]/residential rehab but minimizes substance use today -Continue gabapentin 300 mg 3 times daily  Mood disorder -Continue lamotrigine 25 mg daily for mood stabilization -Increase Seroquel to 50mg  bid for anxiety/mood and continue 00 mg at bedtime for mood and sleep  Anxiety -Continue gabapentin 300 mg 3 times daily - Increase Seroquel to 50mg  bid and increase to 100mg  qhs - Continue hydroxyzine to 50 mg Q6H PRN - Consider trial of Buspar if symptoms do not improve  Insomnia -Continue Seroquel 100 mg at bedtime -Continue trazodone 50 mg nightly PRN insomnia Discharge planning in progress  , MD, FAPA 10/19/2020, 7:06 AM

## 2020-10-19 NOTE — Progress Notes (Signed)
Adult Psychoeducational Group Note  Date:  10/19/2020 Time:  10:00am  Group Topic/Focus:  The focus of this group is to help patients establish daily goals to achieve during treatment and discuss how the patient can incorporate goal setting into their daily lives to aide in recovery.   Participation Level:  Active  Participation Quality:  Appropriate, Attentive, Sharing, and Supportive  Affect:  Anxious and Appropriate  Cognitive:  Alert and Appropriate  Insight: Good  Engagement in Group:  Engaged, Improving, and Supportive  Modes of Intervention:  Discussion. education, rapport building and support.  Additional Comments:  Goal for today: "I need to take control of my anxiety and get the right meds.  Pt interacts positively with peers in milieu, observed pt encourage all peers in the dayroom.  Monique Floyd 10/19/2020, 10:00am

## 2020-10-19 NOTE — Progress Notes (Signed)
   10/19/20 2237  Psych Admission Type (Psych Patients Only)  Admission Status Voluntary  Psychosocial Assessment  Patient Complaints Anxiety  Eye Contact Fair  Facial Expression Anxious  Affect Appropriate to circumstance  Speech Logical/coherent  Interaction Assertive  Motor Activity Other (Comment) (WDL)  Appearance/Hygiene Unremarkable  Behavior Characteristics Appropriate to situation  Mood Anxious;Pleasant  Thought Process  Coherency WDL  Content WDL  Delusions None reported or observed  Perception WDL  Hallucination None reported or observed  Judgment Poor  Confusion None  Danger to Self  Current suicidal ideation? Denies  Danger to Others  Danger to Others None reported or observed

## 2020-10-19 NOTE — Progress Notes (Signed)
Pt says she woke up about 3 times last night but was able to fall back asleep. Feels the increase in Seroquel helped but her anxiety is still high. Rates anxiety 8/10 first thing this morning. Wants a new medication regimen for her anxiety.

## 2020-10-19 NOTE — Progress Notes (Addendum)
   10/19/20 1300  Psych Admission Type (Psych Patients Only)  Admission Status Voluntary  Psychosocial Assessment  Patient Complaints Anxiety  Eye Contact Fair  Facial Expression Anxious  Affect Appropriate to circumstance  Speech Logical/coherent  Interaction Assertive  Motor Activity Other (Comment) (wnl)  Appearance/Hygiene Unremarkable  Behavior Characteristics Cooperative;Anxious  Mood Anxious  Thought Process  Coherency WDL  Content WDL  Delusions None reported or observed  Perception WDL  Hallucination None reported or observed  Judgment Poor  Confusion None  Danger to Self  Current suicidal ideation? Denies  Danger to Others  Danger to Others None reported or observed   Pt observed on the unit throughout the shift interacting well with peers and staff. Pt continues to endorse anxiety, rating her anxiety a 10/10 on her self inventory sheet. Pt wrote that her goal today was to "take control of my anxiety by getting the right meds". Pt denies SI/HI and A/VH.

## 2020-10-19 NOTE — [facility] Group Notes (Signed)
[facility] LCSW Group Therapy Note  10/19/2020    Type of Therapy and Topic:  Group Therapy:  Songs to [employer] Actions at Discharge  Participation Level:  Active   Description of Group:   In this group, two songs were played to re-emphasize group on fear from yesterday:  Dear Insecurity and [employer].  Patients were given the opportunity to share their feelings and thoughts.  Patients were then introduced to the concept that additional supports including self-support are an essential part of recovery.  A song (My Own Hero) was played and a group discussion ensued in which patients stated the song inspired them to realize they have be willing to help themselves in order to succeed, because other people cannot achieve sobriety or stability for them.  Patients were encouraged toward self-advocacy and self-support as part of their recovery, including the establishment of appropriate boundaries in various relationships.  They also identified at least one support they need to add in order to achieve their goals at discharge.   Therapeutic Goals: 1)  explain the difference between healthy and unhealthy supports  2)  demonstrate the importance of being a key part of one's own support system 3)  discuss the need for appropriate boundaries with supports 4)  elicit ideas from patients about supports that need to be added in order to achieve goals   Summary of Patient Progress:   The patient was not present at the first part of group because she was with a provider.  The patient participated fully and participated in the discussion. .  Therapeutic Modalities:   Motivational Interviewing Activity  Lynnell Chad

## 2020-10-19 NOTE — [facility] Group Notes (Signed)
Adult Psychoeducational Group Note  Date:  10/19/2020 Time:  8:57 PM  Group Topic/Focus:  Coping With Mental Health Crisis:   The purpose of this group is to help patients identify strategies for coping with mental health crisis.  Group discusses possible causes of crisis and ways to manage them effectively.  Participation Level:  Active  Participation Quality:  Appropriate  Affect:  Appropriate  Cognitive:  Alert  Insight: Good  Engagement in Group:  Engaged  Modes of Intervention:  Discussion  Additional Comments  Monique Floyd 10/19/2020, 8:57 PM

## 2020-10-20 MED ORDER — QUETIAPINE FUMARATE 300 MG PO TABS
300.0000 mg | ORAL_TABLET | Freq: Every day | ORAL | Status: DC
  Administered 2020-10-20: 300 mg via ORAL
  Filled 2020-10-20 (×2): qty 1

## 2020-10-20 MED ORDER — HYDROXYZINE HCL 25 MG PO TABS
25.0000 mg | ORAL_TABLET | Freq: Four times a day (QID) | ORAL | Status: DC | PRN
  Administered 2020-10-20 – 2020-10-22 (×5): 25 mg via ORAL
  Filled 2020-10-20 (×5): qty 1

## 2020-10-20 MED ORDER — QUETIAPINE FUMARATE 50 MG PO TABS: 50.0000 mg | ORAL_TABLET | Freq: Every evening | ORAL | Status: DC | PRN

## 2020-10-20 MED ORDER — HYDROXYZINE HCL 25 MG PO TABS
25.0000 mg | ORAL_TABLET | Freq: Three times a day (TID) | ORAL | Status: DC | PRN
  Administered 2020-10-20: 25 mg via ORAL
  Filled 2020-10-20: qty 1

## 2020-10-20 MED ORDER — HYDROXYZINE HCL 25 MG PO TABS
25.0000 mg | ORAL_TABLET | Freq: Once | ORAL | Status: AC
  Administered 2020-10-20: 25 mg via ORAL
  Filled 2020-10-20 (×2): qty 1

## 2020-10-20 MED ORDER — BUPROPION HCL ER (XL) 150 MG PO TB24
150.0000 mg | ORAL_TABLET | Freq: Every day | ORAL | Status: DC
  Administered 2020-10-20 – 2020-10-22 (×3): 150 mg via ORAL
  Filled 2020-10-20 (×5): qty 1

## 2020-10-20 MED ORDER — HYDROXYZINE HCL 50 MG PO TABS: 50.0000 mg | ORAL_TABLET | Freq: Three times a day (TID) | ORAL | Status: DC | PRN

## 2020-10-20 NOTE — Progress Notes (Signed)
Adult Psychoeducational Group Note  Date:  10/20/2020 Time:  10:45 AM  Group Topic/Focus:  Goals Group:   The focus of this group is to help patients establish daily goals to achieve during treatment and discuss how the patient can incorporate goal setting into their daily lives to aide in recovery.  Participation Level:  Active  Participation Quality:  Appropriate and Attentive  Affect:  Appropriate  Cognitive:  Appropriate  Insight: Appropriate and Good  Engagement in Group:  Engaged  Modes of Intervention:  Discussion  Additional Comments:  Pt has a goal today to continue making her plan on getting home and being healthy. Pt would also like to speak to the MD at some point.   Monique Floyd 10/20/2020, 10:45 AM

## 2020-10-20 NOTE — Progress Notes (Signed)
[facility] MD Progress Note  10/20/2020 5:01 PM Monique Floyd  MRN:  [id number]  Reason for admission:  Monique Floyd is a 40 year old female with no past psychiatric history other than a history of anxiety treated through her PCP in the past who presented to [facility] ED on 10/13/2020 via law enforcement called by husband following an argument with her husband over her alcohol use and after she placed a gun to her chin and attempted to pull the trigger. BAL in the ED was 0.28 and UDS was positive for marijuana.  Objective: Medical record reviewed.  Patient's case discussed in detail with members of the treatment team.  I met with and evaluated the patient on the unit today for follow-up.  Patient presents with anxious, depressed mood and congruent intermittently tearful affect.  She states her belief that she has always been depressed since 06-04-02 when her mother passed away but she was unable to tell her family or her doctor because she did not want to burden them and felt embarrassed about it.  She rates her depression as 8 out of 10 in intensity.  She continues to report high level of anxiety 10 out of 10 in intensity.  Patient states that she typically copes with her anxiety by staying busy at home or at work.  Patient reports that her anxiety and depression are increased now in the hospital because she does not have her usual work and household tasks to distract herself.  Patient acknowledges that she was taking Xanax 0.25 mg tablet once daily PRN in the days leading up to admission.  Patient denies SI or passive wish for death.  She continues to deny that she planned any suicide attempt prior to admission and states the grabbing of the gun was impulsive.  Patient reports ongoing middle insomnia and requests additional medication for sleep.  She requests to start treatment with an antidepressant for her depression.  I discussed with her the possibility of antidepressant induced hypomanic or manic episode and  patient stated understanding.  She denies any history of seizure and is receptive to a trial of Wellbutrin to treat her depression and anxiety.  We discussed that we would also simultaneously increase her Seroquel to 300 mg at bedtime for mood stabilization and to help her sleep.  She denies any medication side effects or any physical problems.  Staff document 6.75 hours of sleep the patient reports awakening every couple of hours throughout the night.  No new labs today.  Patient is taking scheduled medications as prescribed.  She has been consistently requesting at least 3 doses per day of PRN hydroxyzine for anxiety.  Principal Problem: Bipolar II disorder ([place]) Diagnosis: Principal Problem:   Bipolar II disorder ([facility]) Active Problems:   Acute alcohol intoxication ([place])   Anxiety disorder, unspecified   Unspecified mood (affective) disorder ([place])  Total Time spent with patient:  25 minutes  Past Psychiatric History: See admission H&P  Past Medical History:  Past Medical History:  Diagnosis Date   Abnormal Pap smear    Anxiety    Asthma    Headache    Prior pregnancy complicated by PIH, antepartum     Past Surgical History:  Procedure Laterality Date   CESAREAN SECTION     EYE SURGERY     Family History: History reviewed. No pertinent family history. Family Psychiatric  History: See admission H&P Social History:  Social History   Substance and Sexual Activity  Alcohol Use Yes   Alcohol/week: 2.0 standard  drinks   Types: 1 Glasses of wine, 1 Standard drinks or equivalent per week     Social History   Substance and Sexual Activity  Drug Use Not Currently    Social History   Socioeconomic History   Marital status: Married    Spouse name: Not on file   Number of children: Not on file   Years of education: Not on file   Highest education level: Not on file  Occupational History   Not on file  Tobacco Use   Smoking status: Former    Types: Cigarettes    Quit date:  06/14/2011    Years since quitting: 9.3   Smokeless tobacco: Not on file  Vaping Use   Vaping Use: Some days  Substance and Sexual Activity   Alcohol use: Yes    Alcohol/week: 2.0 standard drinks    Types: 1 Glasses of wine, 1 Standard drinks or equivalent per week   Drug use: Not Currently   Sexual activity: Yes  Other Topics Concern   Not on file  Social History Narrative   Not on file   Social Determinants of Health   Financial Resource Strain: Not on file  Food Insecurity: Not on file  Transportation Needs: Not on file  Physical Activity: Not on file  Stress: Not on file  Social Connections: Not on file   Additional Social History:                         Sleep: Fair  Appetite:  Good  Current Medications: Current Facility-Administered Medications  Medication Dose Route Frequency Provider Last Rate Last Admin   acetaminophen (TYLENOL) tablet 650 mg  650 mg Oral Q6H PRN Chalmers Guest, NP   650 mg at 10/20/20 0753   alum & mag hydroxide-simeth (MAALOX/MYLANTA) 200-200-20 MG/5ML suspension 30 mL  30 mL Oral Q4H PRN Chalmers Guest, NP       buPROPion (WELLBUTRIN XL) 24 hr tablet 150 mg  150 mg Oral Daily Arthor Captain, MD   150 mg at 10/20/20 1130   calcium carbonate (TUMS - dosed in mg elemental calcium) chewable tablet 400 mg of elemental calcium  2 tablet Oral TID Arthor Captain, MD   400 mg of elemental calcium at 10/20/20 1700   ferrous gluconate (FERGON) tablet 324 mg  324 mg Oral QHS Arthor Captain, MD   324 mg at 54/27/06 2376   folic acid (FOLVITE) tablet 1 mg  1 mg Oral QHS Arthor Captain, MD   1 mg at 10/19/20 2139   gabapentin (NEURONTIN) capsule 300 mg  300 mg Oral TID Lindell Spar I, NP   300 mg at 10/20/20 1700   hydrOXYzine (ATARAX/VISTARIL) tablet 25 mg  25 mg Oral TID PRN Arthor Captain, MD   25 mg at 10/20/20 1308   lamoTRIgine (LAMICTAL) tablet 25 mg  25 mg Oral Daily Arthor Captain, MD   25 mg at 10/20/20 0744   loperamide (IMODIUM)  capsule 2-4 mg  2-4 mg Oral PRN Harlow Asa, MD       magnesium hydroxide (MILK OF MAGNESIA) suspension 30 mL  30 mL Oral Daily PRN Chalmers Guest, NP       multivitamin animal shapes (with Ca/FA) chewable tablet 1 tablet  1 tablet Oral BID Arthor Captain, MD   1 tablet at 10/20/20 1700   nicotine (NICODERM CQ - dosed in mg/24 hours) patch 21 mg  21 mg Transdermal Q0600 Chalmers Guest, NP   21 mg at 10/20/20 0746   ondansetron (ZOFRAN-ODT) disintegrating tablet 4 mg  4 mg Oral Q6H PRN Harlow Asa, MD       QUEtiapine (SEROQUEL) tablet 300 mg  300 mg Oral QHS Arthor Captain, MD       thiamine tablet 100 mg  100 mg Oral Daily Arthor Captain, MD   100 mg at 10/20/20 0745   vitamin B-12 (CYANOCOBALAMIN) tablet 2,500 mcg  2,500 mcg Oral BID Arthor Captain, MD   2,500 mcg at 10/20/20 0745   Vitamin D3 (Vitamin D) tablet 5,000 Units  5,000 Units Oral Daily Arthor Captain, MD   5,000 Units at 10/20/20 7793    Lab Results:  No results found for this or any previous visit (from the past 48 hour(s)).   Blood Alcohol level:  No results found for: [facility]  Metabolic Disorder Labs: Lab Results  Component Value Date   HGBA1C 4.8 10/15/2020   MPG 91.06 10/15/2020   No results found for: PROLACTIN Lab Results  Component Value Date   CHOL 131 10/15/2020   TRIG 42 10/15/2020   HDL 54 10/15/2020   CHOLHDL 2.4 10/15/2020   VLDL 8 10/15/2020   LDLCALC 69 10/15/2020    Physical Findings: AIMS:  , ,  ,  ,    CIWA:  CIWA-Ar Total: 0 COWS:     Musculoskeletal: Strength & Muscle Tone: within normal limits Gait & Station: normal Patient leans: N/A  Psychiatric Specialty Exam:  Presentation  General Appearance: Appropriate for Environment; Neat  Eye Contact:Good  Speech:Clear and Coherent; Normal Rate  Speech Volume:Normal  Handedness: No data recorded  Mood and Affect  Mood:Depressed; Anxious  Affect:Congruent; Tearful   Thought Process  Thought Processes:Coherent;  Goal Directed  Descriptions of Associations:Intact  Orientation:Full (Time, Place and Person)  Thought Content:Logical  History of Schizophrenia/Schizoaffective disorder:No data recorded Duration of Psychotic Symptoms:No data recorded Hallucinations:Hallucinations: None  Ideas of Reference:None  Suicidal Thoughts:Suicidal Thoughts: No  Homicidal Thoughts:Homicidal Thoughts: No   Sensorium  Memory:Immediate Good; Recent Good  Judgment:Fair  Insight:Fair   Executive Functions  Concentration:Good  Attention Span:Good  [place] of Knowledge:Good  Language:Good   Psychomotor Activity  Psychomotor Activity:Psychomotor Activity: Normal   Assets  Assets:Communication Skills; Desire for Improvement; Housing; Resilience; Social Support; Vocational/Educational   Sleep  Sleep:Sleep: Fair Number of Hours of Sleep: 6.75    Physical Exam: Physical Exam Vitals and nursing note reviewed.  Constitutional:      General: She is not in acute distress.    Appearance: She is not diaphoretic.  HENT:     Head: Normocephalic and atraumatic.  Cardiovascular:     Rate and Rhythm: Normal rate.  Pulmonary:     Effort: Pulmonary effort is normal.  Neurological:     General: No focal deficit present.     Mental Status: She is alert and oriented to person, place, and time.   Review of Systems  Constitutional:  Negative for diaphoresis and fever.  HENT: Negative.    Respiratory:  Negative for cough and shortness of breath.   Cardiovascular:  Negative for chest pain and palpitations.  Gastrointestinal:  Negative for constipation, diarrhea, nausea and vomiting.  Musculoskeletal: Negative.   Skin:  Negative for rash.  Neurological:  Negative for dizziness, tremors, seizures and headaches.  Psychiatric/Behavioral:  Positive for depression. Negative for hallucinations and suicidal ideas. The patient is nervous/anxious and has insomnia.  All other systems reviewed and  are negative. Blood pressure (!) 149/95, pulse 84, temperature 97.9 F (36.6 C), temperature source Oral, resp. rate 16, height $RemoveBe'5\' 3"'lbPwEFNSA$  (1.6 m), weight 71.7 kg, SpO2 100 %. Body mass index is 27.99 kg/m.   Treatment Plan Summary: Daily contact with patient to assess and evaluate symptoms and progress in treatment and Medication management  Alcohol use disorder -Patient has not displayed any signs of alcohol withdrawal -Will discontinue CIWA protocol and PRN lorazepam for CIWA score >10. -I have advised her to participate in substance abuse treatment program after discharge if she is unable to refrain from alcohol or substance use on her own. -Continue gabapentin 300 mg 3 times daily  Mood disorder -Continue lamotrigine 25 mg daily for mood stabilization -Increase and consolidate Seroquel to 300 mg at bedtime for mood stabilization and sleep -Start Wellbutrin XL 150 mg daily for depression  Anxiety -Continue gabapentin 300 mg 3 times daily -Decrease hydroxyzine to 25 mg Q6H PRN  Insomnia -Increase and consolidate Seroquel to 300 mg at bedtime for mood stabilization and sleep -Start Seroquel 50 mg at bedtime PRN   -Discontinue trazodone 50 mg nightly PRN  Discharge planning in progress  Arthor Captain, MD 10/20/2020, 5:01 PM

## 2020-10-20 NOTE — [facility] Group Notes (Signed)
LCSW Group Therapy Note  10/20/2020   Type of Therapy and Topic:  Group Therapy - Healthy vs Unhealthy Coping Skills  Participation Level:  Active   Description of Group The focus of this group was to determine what unhealthy coping techniques typically are used by group members and what healthy coping techniques would be helpful in coping with various problems. Patients were guided in becoming aware of the differences between healthy and unhealthy coping techniques. Patients were asked to identify 2-3 healthy coping skills they would like to learn to use more effectively, and many mentioned meditation, breathing, and relaxation. These were explained, samples demonstrated, and resources shared for how to learn more at discharge. At group closing, additional ideas of healthy coping skills were shared in a fun exercise.  Therapeutic Goals Patients learned that coping is what human beings do all day long to deal with various situations in their lives Patients defined and discussed healthy vs unhealthy coping techniques Patients identified their preferred coping techniques and identified whether these were healthy or unhealthy Patients determined 2-3 healthy coping skills they would like to become more familiar with and use more often, and practiced a few medications Patients provided support and ideas to each other   Summary of Patient Progress:  Pt attended group and participated appropriately   Therapeutic Modalities Cognitive Behavioral Therapy Motivational Interviewing  Taleisha Kaczynski, LCSWA Clinicial Social Worker [facility]   

## 2020-10-20 NOTE — Plan of Care (Signed)
Nurse discussed anxiety, depression and coping skills with patient.  

## 2020-10-20 NOTE — Progress Notes (Signed)
D:  Patient's self inventory sheet, patient sleeps fair.  Fair appetite, normal energy level, poor, good concentration.  Does not know how to define depression, denied hopeless, rated anxiety #10.  Denied withdrawals.  Denied SI.  Physical problems, headaches.  Denied physical pain.  Goal is define plan to discharge home and be healthy.  Plans to speak to MD.   A:  Medications administered per MD orders.  Emotional support and encouragement given patient. R:  Safety maintained with 15 minute checks.   Denied SI and HI, contracts for safety.   Denied A/V hallucinations.  Safety maintained with 15 minute checks.

## 2020-10-20 NOTE — Tx Team (Signed)
Interdisciplinary Treatment and Diagnostic Plan Update  10/20/2020 Time of Session: 9:00am  Monique Floyd MRN: [id number]  Principal Diagnosis: Bipolar II disorder (HCC)  Secondary Diagnoses: Principal Problem:   Bipolar II disorder (HCC) Active Problems:   Acute alcohol intoxication (HCC)   Anxiety disorder, unspecified   Unspecified mood (affective) disorder (HCC)   Current Medications:  Current Facility-Administered Medications  Medication Dose Route Frequency Provider Last Rate Last Admin   acetaminophen (TYLENOL) tablet 650 mg  650 mg Oral Q6H PRN Novella Olive, NP   650 mg at 10/20/20 0753   alum & mag hydroxide-simeth (MAALOX/MYLANTA) 200-200-20 MG/5ML suspension 30 mL  30 mL Oral Q4H PRN Novella Olive, NP       calcium carbonate (TUMS - dosed in mg elemental calcium) chewable tablet 400 mg of elemental calcium  2 tablet Oral TID Claudie Revering, MD   400 mg of elemental calcium at 10/20/20 0742   ferrous gluconate (FERGON) tablet 324 mg  324 mg Oral QHS Claudie Revering, MD   324 mg at 10/19/20 2140   folic acid (FOLVITE) tablet 1 mg  1 mg Oral QHS Claudie Revering, MD   1 mg at 10/19/20 2139   gabapentin (NEURONTIN) capsule 300 mg  300 mg Oral TID Armandina Stammer I, NP   300 mg at 10/20/20 0743   hydrOXYzine (ATARAX/VISTARIL) tablet 50 mg  50 mg Oral TID PRN Claudie Revering, MD       lamoTRIgine (LAMICTAL) tablet 25 mg  25 mg Oral Daily Claudie Revering, MD   25 mg at 10/20/20 0744   loperamide (IMODIUM) capsule 2-4 mg  2-4 mg Oral PRN Comer Locket, MD       magnesium hydroxide (MILK OF MAGNESIA) suspension 30 mL  30 mL Oral Daily PRN Novella Olive, NP       multivitamin animal shapes (with Ca/FA) chewable tablet 1 tablet  1 tablet Oral BID Claudie Revering, MD   1 tablet at 10/20/20 0749   nicotine (NICODERM CQ - dosed in mg/24 hours) patch 21 mg  21 mg Transdermal Q0600 Novella Olive, NP   21 mg at 10/20/20 0746   ondansetron (ZOFRAN-ODT) disintegrating tablet 4 mg  4 mg  Oral Q6H PRN Comer Locket, MD       QUEtiapine (SEROQUEL) tablet 100 mg  100 mg Oral QHS Mason Jim, Amy E, MD   100 mg at 10/19/20 2139   QUEtiapine (SEROQUEL) tablet 50 mg  50 mg Oral BID Comer Locket, MD   50 mg at 10/20/20 2202   thiamine tablet 100 mg  100 mg Oral Daily Claudie Revering, MD   100 mg at 10/20/20 0745   traZODone (DESYREL) tablet 50 mg  50 mg Oral QHS PRN Comer Locket, MD   50 mg at 10/19/20 2141   vitamin B-12 (CYANOCOBALAMIN) tablet 2,500 mcg  2,500 mcg Oral BID Claudie Revering, MD   2,500 mcg at 10/20/20 0745   Vitamin D3 (Vitamin D) tablet 5,000 Units  5,000 Units Oral Daily Claudie Revering, MD   5,000 Units at 10/20/20 5427   PTA Medications: Medications Prior to Admission  Medication Sig Dispense Refill Last Dose   Calcium Carb-Cholecalciferol (CALCIUM PLUS VITAMIN D) 500-200 MG-UNIT TABS Take 1 tablet by mouth 3 (three) times daily.      Cholecalciferol (VITAMIN D3) 125 MCG (5000 UT) TABS Take 5,000 Units by mouth daily.      Cyanocobalamin (B-12  SUPER STRENGTH) 5000 MCG/ML LIQD Place 5,000 mcg under the tongue 2 (two) times daily.      Ferrous Gluconate-C-Folic Acid (IRON-C PO) Take 1 tablet by mouth at bedtime.      OVER THE COUNTER MEDICATION Take 1 tablet by mouth 2 (two) times daily. Celebrate-ADEK (Barriatric vitamin)       Patient Stressors: Occupational concerns Other: 16 hour work day since last Tuesday  Patient Strengths: Ability for insight Average or above average intelligence General fund of knowledge Supportive family/friends  Treatment Modalities: Medication Management, Group therapy, Case management,  1 to 1 session with clinician, Psychoeducation, Recreational therapy.   Physician Treatment Plan for Primary Diagnosis: Bipolar II disorder (HCC) Long Term Goal(s): Improvement in symptoms so as ready for discharge   Short Term Goals: Ability to identify changes in lifestyle to reduce recurrence of condition will improve Ability to  verbalize feelings will improve Ability to disclose and discuss suicidal ideas Ability to demonstrate self-control will improve Ability to identify and develop effective coping behaviors will improve Ability to maintain clinical measurements within normal limits will improve Compliance with prescribed medications will improve Ability to identify triggers associated with substance abuse/mental health issues will improve  Medication Management: Evaluate patient's response, side effects, and tolerance of medication regimen.  Therapeutic Interventions: 1 to 1 sessions, Unit Group sessions and Medication administration.  Evaluation of Outcomes: Progressing  Physician Treatment Plan for Secondary Diagnosis: Principal Problem:   Bipolar II disorder (HCC) Active Problems:   Acute alcohol intoxication (HCC)   Anxiety disorder, unspecified   Unspecified mood (affective) disorder (HCC)  Long Term Goal(s): Improvement in symptoms so as ready for discharge   Short Term Goals: Ability to identify changes in lifestyle to reduce recurrence of condition will improve Ability to verbalize feelings will improve Ability to disclose and discuss suicidal ideas Ability to demonstrate self-control will improve Ability to identify and develop effective coping behaviors will improve Ability to maintain clinical measurements within normal limits will improve Compliance with prescribed medications will improve Ability to identify triggers associated with substance abuse/mental health issues will improve     Medication Management: Evaluate patient's response, side effects, and tolerance of medication regimen.  Therapeutic Interventions: 1 to 1 sessions, Unit Group sessions and Medication administration.  Evaluation of Outcomes: Progressing   RN Treatment Plan for Primary Diagnosis: Bipolar II disorder (HCC) Long Term Goal(s): Knowledge of disease and therapeutic regimen to maintain health will  improve  Short Term Goals: Ability to remain free from injury will improve, Ability to participate in decision making will improve, Ability to verbalize feelings will improve, Ability to disclose and discuss suicidal ideas, and Ability to identify and develop effective coping behaviors will improve  Medication Management: RN will administer medications as ordered by provider, will assess and evaluate patient's response and provide education to patient for prescribed medication. RN will report any adverse and/or side effects to prescribing provider.  Therapeutic Interventions: 1 on 1 counseling sessions, Psychoeducation, Medication administration, Evaluate responses to treatment, Monitor vital signs and CBGs as ordered, Perform/monitor CIWA, COWS, AIMS and Fall Risk screenings as ordered, Perform wound care treatments as ordered.  Evaluation of Outcomes: Progressing   LCSW Treatment Plan for Primary Diagnosis: Bipolar II disorder (HCC) Long Term Goal(s): Safe transition to appropriate next level of care at discharge, Engage patient in therapeutic group addressing interpersonal concerns.  Short Term Goals: Engage patient in aftercare planning with referrals and resources, Increase social support, Increase emotional regulation, Facilitate acceptance of mental health  diagnosis and concerns, Facilitate patient progression through stages of change regarding substance use diagnoses and concerns, and Identify triggers associated with mental health/substance abuse issues  Therapeutic Interventions: Assess for all discharge needs, 1 to 1 time with Social worker, Explore available resources and support systems, Assess for adequacy in community support network, Educate family and significant other(s) on suicide prevention, Complete Psychosocial Assessment, Interpersonal group therapy.  Evaluation of Outcomes: Progressing   Progress in Treatment: Attending groups: Yes. Participating in groups: Yes. Taking  medication as prescribed: Yes. Toleration medication: Yes. Family/Significant other contact made: Yes, individual(s) contacted:  husband Patient understands diagnosis: Yes. Discussing patient identified problems/goals with staff: Yes. Medical problems stabilized or resolved: Yes. Denies suicidal/homicidal ideation: Yes. Issues/concerns per patient self-inventory: No.   New problem(s) identified: No, Describe:  None   New Short Term/Long Term Goal(s): medication stabilization, elimination of SI thoughts, development of comprehensive mental wellness plan.   Patient Goals: "To go home"  Discharge Plan or Barriers: Patient is to return home with husband and is to begin CDIOP at discharge.  Reason for Continuation of Hospitalization: Anxiety Medication stabilization  Estimated Length of Stay: 1-3 days  Attendees: Patient:  10/20/2020   Physician:  10/20/2020   Nursing:  10/20/2020   RN Care Manager: 10/20/2020   Social Worker: Ruthann Cancer, LCSW 10/20/2020   Recreational Therapist:  10/20/2020   Other:  10/20/2020   Other:  10/20/2020   Other: 10/20/2020     Scribe for Treatment Team: Otelia Santee, LCSW 10/20/2020 10:12 AM

## 2020-10-20 NOTE — [facility] Group Notes (Signed)
Occupational Therapy Group Note Date: 10/20/2020 Group Topic/Focus: Coping Skills  Group Description: Group Description: Group encouraged increased engagement and participation through discussion focused on coping through music. Group members were encouraged to create a "coping skills playlist" that consisted of 20 songs with different cited categories/topics including "a song that reminds you of a good memory" and "a song that makes you want to dance", etc. Discussion followed with patients sharing their playlists and choosing one for the group to listen and respond too.   Therapeutic Goal(s): Identify positive songs to improve coping through music Identify and share benefit of music as a coping strategy  Participation Level: Active   Participation Quality: Independent   Behavior: Calm, Cooperative, and Interactive   Speech/Thought Process: Focused   Affect/Mood: Euthymic   Insight: Fair   Judgement: Fair   Individualization: Monique Floyd was active in their participation of group discussion/activity. Pt identified benefit of music as a coping skill "relate to it".   Modes of Intervention: Activity, Discussion, and Education  Patient Response to Interventions:  Attentive, Engaged, and Interested   Plan: Continue to engage patient in OT groups 2 - 3x/week.  10/20/2020  Donne Hazel, MOT, OTR/L

## 2020-10-20 NOTE — [facility] Group Notes (Signed)
[facility] Group Notes:  (Nursing/MHT/Case Management/Adjunct)  Date:  10/20/2020  Time:  11:13 PM  Type of Therapy:  Group Therapy  Participation Level:  Active  Participation Quality:  Attentive  Affect:  Appropriate  Cognitive:  Appropriate  Insight:  Improving  Engagement in Group:  Developing/Improving  Modes of Intervention:  Discussion  Summary of Progress/Problems:  Lorita Officer 10/20/2020, 11:13 PM

## 2020-10-21 LAB — RESP PANEL BY RT-PCR (FLU A&B, COVID) ARPGX2
Influenza A by PCR: NEGATIVE
Influenza B by PCR: NEGATIVE
SARS Coronavirus 2 by RT PCR: NEGATIVE

## 2020-10-21 MED ORDER — QUETIAPINE FUMARATE 200 MG PO TABS
500.0000 mg | ORAL_TABLET | Freq: Every day | ORAL | Status: DC
  Administered 2020-10-21: 500 mg via ORAL
  Filled 2020-10-21 (×3): qty 2.5

## 2020-10-21 MED ORDER — QUETIAPINE FUMARATE 100 MG PO TABS
100.0000 mg | ORAL_TABLET | Freq: Every day | ORAL | Status: DC
  Administered 2020-10-21 – 2020-10-22 (×2): 100 mg via ORAL
  Filled 2020-10-21 (×5): qty 1

## 2020-10-21 NOTE — [facility] Group Notes (Signed)
Adult Psychoeducational Group Note  Date:  10/21/2020 Time:  10:24 AM  Group Topic/Focus:  Goals Group:   The focus of this group is to help patients establish daily goals to achieve during treatment and discuss how the patient can incorporate goal setting into their daily lives to aide in recovery.  Participation Level:  Active  Participation Quality:  Attentive  Affect:  Appropriate  Cognitive:  Appropriate  Insight: Appropriate  Engagement in Group:  Engaged  Modes of Intervention:  Discussion  Additional Comments:  Patient attended goals group and stayed appropriate and engaged the duration of the group. Patient's goal was to stay hopeful and positive.   Takuma Cifelli T Maxen Rowland 10/21/2020, 10:24 AM

## 2020-10-21 NOTE — Progress Notes (Signed)
   10/20/20 2337  Psych Admission Type (Psych Patients Only)  Admission Status Voluntary  Psychosocial Assessment  Patient Complaints None  Eye Contact Fair  Facial Expression Anxious  Affect Appropriate to circumstance  Speech Logical/coherent  Interaction Assertive  Motor Activity Other (Comment) (WDL)  Appearance/Hygiene Unremarkable  Behavior Characteristics Cooperative  Mood Pleasant  Thought Process  Coherency WDL  Content WDL  Delusions None reported or observed  Perception WDL  Hallucination None reported or observed  Judgment Poor  Confusion None  Danger to Self  Current suicidal ideation? Denies  Danger to Others  Danger to Others None reported or observed

## 2020-10-21 NOTE — Progress Notes (Signed)
[facility] MD Progress Note  10/21/2020 4:08 PM Sukhman Kocher  MRN:  [id number]  Reason for admission:  Monique Floyd is a 40 year old female with no past psychiatric history other than a history of anxiety treated through her PCP in the past who presented to [facility] ED on 10/13/2020 via law enforcement called by husband following an argument with her husband over her alcohol use and after she placed a gun to her chin and attempted to pull the trigger. BAL in the ED was 0.28 and UDS was positive for marijuana.  Objective: Medical record reviewed.  Patient's case discussed in detail with members of the treatment team.  I met with and evaluated the patient on the unit today for follow-up.  Patient appears a bit better today.  She looks calmer and displays more stable affect.  Patient continues to report significant anxiety, racing thoughts, low mood and sleep disturbance with middle insomnia.  She denies other hypomanic or manic symptoms and denies anhedonia.  Patient denies passive wish for death, SI, AI, HI, PI or AVH.  Patient denies orthostasis, sedation, headache or any medication side effects.  She denies fever, chills, sweats, headache, sore throat, cough, trouble breathing, nausea, vomiting, diarrhea, myalgias or other physical problems.  Patient reports feeling slightly less anxious today since taking higher dose of Seroquel 100 mg this morning in addition to 300 mg last night.  She is eager to be connected with a therapist and psychiatrist with whom she can follow-up after discharge.  I again discussed the indications for, anticipated treatment outcomes from, potential side effects, risks, benefits, etc, of her medications with patient again today.  Patient stated understanding.  She is hoping for discharge soon.  Patient states she feels better since she discussed with her husband yesterday that she has felt depressed for a long time and he was supportive.  She reports feeling good about finally  seeking treatment.  Patient estimates only 3+ hours of broken sleep last night.  Nursing staff recorded patient slept 6.5 hours last night.  Vital signs this morning include BP of 127/70 sitting and 110/74 standing; pulse of 80 sitting and 106 standing; O2 sat of 100% on room air and temperature of 97.6.  No new labs today.  Patient is taking scheduled medications as prescribed.  She has required less frequent PRN hydroxyzine today.  Principal Problem: Bipolar II disorder ([place]) Diagnosis: Principal Problem:   Bipolar II disorder (HCC) Active Problems:   Acute alcohol intoxication ([place])   Anxiety disorder, unspecified   Unspecified mood (affective) disorder ([place])  Total Time spent with patient:  25 minutes  Past Psychiatric History: See admission H&P  Past Medical History:  Past Medical History:  Diagnosis Date   Abnormal Pap smear    Anxiety    Asthma    Headache    Prior pregnancy complicated by PIH, antepartum     Past Surgical History:  Procedure Laterality Date   CESAREAN SECTION     EYE SURGERY     Family History: History reviewed. No pertinent family history. Family Psychiatric  History: See admission H&P Social History:  Social History   Substance and Sexual Activity  Alcohol Use Yes   Alcohol/week: 2.0 standard drinks   Types: 1 Glasses of wine, 1 Standard drinks or equivalent per week     Social History   Substance and Sexual Activity  Drug Use Not Currently    Social History   Socioeconomic History   Marital status: Married    Spouse  name: Not on file   Number of children: Not on file   Years of education: Not on file   Highest education level: Not on file  Occupational History   Not on file  Tobacco Use   Smoking status: Former    Types: Cigarettes    Quit date: 06/14/2011    Years since quitting: 9.3   Smokeless tobacco: Not on file  Vaping Use   Vaping Use: Some days  Substance and Sexual Activity   Alcohol use: Yes    Alcohol/week: 2.0  standard drinks    Types: 1 Glasses of wine, 1 Standard drinks or equivalent per week   Drug use: Not Currently   Sexual activity: Yes  Other Topics Concern   Not on file  Social History Narrative   Not on file   Social Determinants of Health   Financial Resource Strain: Not on file  Food Insecurity: Not on file  Transportation Needs: Not on file  Physical Activity: Not on file  Stress: Not on file  Social Connections: Not on file   Additional Social History:                         Sleep: Fair  Appetite:  Good  Current Medications: Current Facility-Administered Medications  Medication Dose Route Frequency Provider Last Rate Last Admin   acetaminophen (TYLENOL) tablet 650 mg  650 mg Oral Q6H PRN Chalmers Guest, NP   650 mg at 10/21/20 0741   alum & mag hydroxide-simeth (MAALOX/MYLANTA) 200-200-20 MG/5ML suspension 30 mL  30 mL Oral Q4H PRN Chalmers Guest, NP       buPROPion (WELLBUTRIN XL) 24 hr tablet 150 mg  150 mg Oral Daily Arthor Captain, MD   150 mg at 10/21/20 9323   calcium carbonate (TUMS - dosed in mg elemental calcium) chewable tablet 400 mg of elemental calcium  2 tablet Oral TID Arthor Captain, MD   400 mg of elemental calcium at 10/21/20 1242   ferrous gluconate (FERGON) tablet 324 mg  324 mg Oral QHS Arthor Captain, MD   324 mg at 55/73/22 0254   folic acid (FOLVITE) tablet 1 mg  1 mg Oral QHS Arthor Captain, MD   1 mg at 10/20/20 2137   gabapentin (NEURONTIN) capsule 300 mg  300 mg Oral TID Lindell Spar I, NP   300 mg at 10/21/20 1242   hydrOXYzine (ATARAX/VISTARIL) tablet 25 mg  25 mg Oral Q6H PRN Arthor Captain, MD   25 mg at 10/21/20 2706   lamoTRIgine (LAMICTAL) tablet 25 mg  25 mg Oral Daily Arthor Captain, MD   25 mg at 10/21/20 0735   magnesium hydroxide (MILK OF MAGNESIA) suspension 30 mL  30 mL Oral Daily PRN Chalmers Guest, NP       multivitamin animal shapes (with Ca/FA) chewable tablet 1 tablet  1 tablet Oral BID Arthor Captain, MD    1 tablet at 10/21/20 0734   nicotine (NICODERM CQ - dosed in mg/24 hours) patch 21 mg  21 mg Transdermal Q0600 Chalmers Guest, NP   21 mg at 10/21/20 0805   QUEtiapine (SEROQUEL) tablet 100 mg  100 mg Oral Daily Arthor Captain, MD   100 mg at 10/21/20 1002   QUEtiapine (SEROQUEL) tablet 50 mg  50 mg Oral QHS PRN Arthor Captain, MD       QUEtiapine (SEROQUEL) tablet 500 mg  500 mg  Oral QHS Arthor Captain, MD       thiamine tablet 100 mg  100 mg Oral Daily Arthor Captain, MD   100 mg at 10/21/20 0732   vitamin B-12 (CYANOCOBALAMIN) tablet 2,500 mcg  2,500 mcg Oral BID Arthor Captain, MD   2,500 mcg at 10/21/20 0732   Vitamin D3 (Vitamin D) tablet 5,000 Units  5,000 Units Oral Daily Arthor Captain, MD   5,000 Units at 10/21/20 3532    Lab Results:  No results found for this or any previous visit (from the past 48 hour(s)).   Blood Alcohol level:  No results found for: [facility]  Metabolic Disorder Labs: Lab Results  Component Value Date   HGBA1C 4.8 10/15/2020   MPG 91.06 10/15/2020   No results found for: PROLACTIN Lab Results  Component Value Date   CHOL 131 10/15/2020   TRIG 42 10/15/2020   HDL 54 10/15/2020   CHOLHDL 2.4 10/15/2020   VLDL 8 10/15/2020   LDLCALC 69 10/15/2020    Physical Findings: AIMS:  , ,  ,  ,    CIWA:  CIWA-Ar Total: 1 COWS:     Musculoskeletal: Strength & Muscle Tone: within normal limits Gait & Station: normal Patient leans: N/A  Psychiatric Specialty Exam:  Presentation  General Appearance: Appropriate for Environment; Neat  Eye Contact:Good  Speech:Clear and Coherent; Normal Rate  Speech Volume:Normal  Handedness: No data recorded  Mood and Affect  Mood:Anxious; Depressed  Affect:Full Range   Thought Process  Thought Processes:Coherent; Goal Directed  Descriptions of Associations:Intact  Orientation:Full (Time, Place and Person)  Thought Content:Logical  History of Schizophrenia/Schizoaffective disorder:No data  recorded Duration of Psychotic Symptoms:No data recorded Hallucinations:Hallucinations: None  Ideas of Reference:None  Suicidal Thoughts:Suicidal Thoughts: No  Homicidal Thoughts:Homicidal Thoughts: No   Sensorium  Memory:Immediate Good; Recent Good  Judgment:Fair  Insight:Fair   Executive Functions  Concentration:Good  Attention Span:Good  [place] of Knowledge:Good  Language:Good   Psychomotor Activity  Psychomotor Activity:Psychomotor Activity: Normal   Assets  Assets:Communication Skills; Desire for Improvement; Housing; Resilience; Social Support; Vocational/Educational   Sleep  Sleep:Sleep: Fair Number of Hours of Sleep: 6.5    Physical Exam: Physical Exam Vitals and nursing note reviewed.  Constitutional:      General: She is not in acute distress.    Appearance: She is not diaphoretic.  HENT:     Head: Normocephalic and atraumatic.  Cardiovascular:     Rate and Rhythm: Normal rate.  Pulmonary:     Effort: Pulmonary effort is normal.  Neurological:     General: No focal deficit present.     Mental Status: She is alert and oriented to person, place, and time.   Review of Systems  Constitutional:  Negative for chills, diaphoresis and fever.  HENT: Negative.  Negative for sore throat.   Respiratory:  Negative for cough and shortness of breath.   Cardiovascular:  Negative for chest pain and palpitations.  Gastrointestinal:  Negative for constipation, diarrhea, nausea and vomiting.  Musculoskeletal: Negative.  Negative for myalgias.  Skin:  Negative for rash.  Neurological:  Negative for dizziness, tremors, seizures and headaches.  Psychiatric/Behavioral:  Positive for depression. Negative for hallucinations and suicidal ideas. The patient is nervous/anxious and has insomnia.   All other systems reviewed and are negative. Blood pressure 110/74, pulse (!) 106, temperature 97.6 F (36.4 C), temperature source Oral, resp. rate 16,  height $Remov'5\' 3"'efaMjS$  (1.6 m), weight 71.7 kg, SpO2 98 %. Body mass  index is 27.99 kg/m.   Treatment Plan Summary: Patient continues to report low mood, continued anxiety and middle insomnia but has appeared a bit calmer, is using less PRN medication for anxiety and has consistently denied suicidal ideation since admission.  She displays future oriented outlook and has multiple protective factors.  She is tolerating upward titration of medications without difficulty.  Believe she is improving and nearing readiness for discharge in the next 24 to 48 hours.  Daily contact with patient to assess and evaluate symptoms and progress in treatment and Medication management  Alcohol use disorder -Patient has not displayed any signs of alcohol withdrawal -CIWA protocol has been discontinued -I have advised her to participate in substance abuse treatment program after discharge if she is unable to refrain from alcohol or substance use on her own. -Continue gabapentin 300 mg 3 times daily  Mood disorder -Continue lamotrigine 25 mg daily for mood stabilization -Increase Seroquel to 100 mg each morning and 500 mg at bedtime for mood symptoms, anxiety and sleep -Continue Wellbutrin XL 150 mg daily for depression  Anxiety -Continue gabapentin 300 mg 3 times daily -Continue hydroxyzine to 25 mg Q6H PRN  Insomnia -Increase Seroquel to 100 mg each morning and 500 mg at bedtime for mood symptoms, anxiety and sleep  Discharge planning in progress.  Arthor Captain, MD 10/21/2020, 4:08 PM

## 2020-10-21 NOTE — Progress Notes (Signed)
D:  Patient denied SI and HI, contracts for safety.  Denied A/V hallucinations.  Denied pain. A:  Medications administered per MD orders.  Emotional support and encouragement given patient. R:  Safety maintained with 15 minute checks.  

## 2020-10-21 NOTE — [facility] Group Notes (Signed)
Spiritual care group on grief and loss facilitated by chaplain Dyanne Carrel, [facility]   Group Goal:   Support / Education around grief and loss   Members engage in facilitated group support and psycho-social education.   Group Description:   Following introductions and group rules, group members engaged in facilitated group dialog and support around topic of loss, with particular support around experiences of loss in their lives. Group Identified types of loss (relationships / self / things) and identified patterns, circumstances, and changes that precipitate losses. Reflected on thoughts / feelings around loss, normalized grief responses, and recognized variety in grief experience. Group noted Worden's four tasks of grief in discussion.   Group drew on Adlerian / Rogerian, narrative, MI,   Patient Progress: Monique Floyd participated in group and was actively engaged in the conversation.  She shared about changing her perspective after she lost her baby (a twin) after delivery.   Chaplain Dyanne Carrel, [facility] Pager, [phone] 11:20 AM

## 2020-10-22 MED ORDER — NICOTINE 21 MG/24HR TD PT24: 21.0000 mg | MEDICATED_PATCH | Freq: Every day | TRANSDERMAL | 0 refills | Status: DC

## 2020-10-22 MED ORDER — QUETIAPINE FUMARATE 100 MG PO TABS
500.0000 mg | ORAL_TABLET | Freq: Every day | ORAL | 0 refills | Status: DC
Start: 2020-10-22 — End: 2020-11-01

## 2020-10-22 MED ORDER — BUPROPION HCL ER (XL) 150 MG PO TB24: 150.0000 mg | ORAL_TABLET | Freq: Every day | ORAL | 0 refills | Status: DC

## 2020-10-22 MED ORDER — QUETIAPINE FUMARATE 100 MG PO TABS: 100.0000 mg | ORAL_TABLET | Freq: Every day | ORAL | 0 refills | Status: DC

## 2020-10-22 MED ORDER — HYDROXYZINE HCL 25 MG PO TABS: 25.0000 mg | ORAL_TABLET | Freq: Four times a day (QID) | ORAL | 0 refills | Status: DC | PRN

## 2020-10-22 MED ORDER — CALCIUM CARBONATE ANTACID 500 MG PO CHEW: 2.0000 | CHEWABLE_TABLET | Freq: Three times a day (TID) | ORAL | 0 refills | Status: AC

## 2020-10-22 MED ORDER — GABAPENTIN 300 MG PO CAPS: 300.0000 mg | ORAL_CAPSULE | Freq: Three times a day (TID) | ORAL | 0 refills | Status: DC

## 2020-10-22 MED ORDER — LAMOTRIGINE 25 MG PO TABS: 25.0000 mg | ORAL_TABLET | Freq: Every day | ORAL | 0 refills | Status: DC
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
## 2020-10-22 NOTE — BHH Suicide Risk Assessment (Signed)
[facility] Discharge Suicide Risk Assessment   Principal Problem: Bipolar II disorder [facility]) Discharge Diagnoses: Principal Problem:   Bipolar II disorder (HCC) Active Problems:   Acute alcohol intoxication (HCC)   Anxiety disorder, unspecified   Unspecified mood (affective) disorder (HCC)   Total Time spent with patient: 20 minutes  Musculoskeletal: Strength & Muscle Tone: within normal limits Gait & Station: normal Patient leans: N/A  Psychiatric Specialty Exam  Presentation  General Appearance: Appropriate for Environment; Neat  Eye Contact:Good  Speech:Clear and Coherent; Normal Rate  Speech Volume:Normal  Handedness: No data recorded  Mood and Affect  Mood:Anxious  Duration of Depression Symptoms: No data recorded Affect:Appropriate   Thought Process  Thought Processes:Coherent; Goal Directed  Descriptions of Associations:Intact  Orientation:Full (Time, Place and Person)  Thought Content:Logical; WDL  History of Schizophrenia/Schizoaffective disorder:No data recorded Duration of Psychotic Symptoms:No data recorded Hallucinations:Hallucinations: None  Ideas of Reference:None  Suicidal Thoughts:Suicidal Thoughts: No  Homicidal Thoughts:Homicidal Thoughts: No   Sensorium  Memory:Immediate Good; Recent Good  Judgment:Good  Insight:Good   Executive Functions  Concentration:Good  Attention Span:Good  Recall:Good  Fund of Knowledge:Good  Language:Good   Psychomotor Activity  Psychomotor Activity:Psychomotor Activity: Normal   Assets  Assets:Communication Skills; Desire for Improvement; Housing; Health and safety inspector; Physical Health; Resilience; Social Support; Transportation; Vocational/Educational   Sleep  Sleep:Sleep: Good Number of Hours of Sleep: 6.5   Physical Exam: Physical Exam Vitals and nursing note reviewed.  Constitutional:      General: She is not in acute distress.    Appearance: Normal appearance. She is  not diaphoretic.  HENT:     Head: Normocephalic and atraumatic.  Cardiovascular:     Rate and Rhythm: Normal rate.  Pulmonary:     Effort: Pulmonary effort is normal.  Neurological:     General: No focal deficit present.     Mental Status: She is alert and oriented to person, place, and time.   Review of Systems  Constitutional:  Negative for chills, diaphoresis, fever and malaise/fatigue.  HENT:  Negative for sore throat.   Respiratory:  Negative for cough and shortness of breath.   Cardiovascular:  Negative for chest pain and palpitations.  Gastrointestinal:  Negative for constipation, diarrhea, nausea and vomiting.  Musculoskeletal:  Negative for myalgias.  Skin:  Negative for rash.  Neurological:  Negative for dizziness, tremors and headaches.  Psychiatric/Behavioral:  Negative for depression, hallucinations and suicidal ideas. The patient is nervous/anxious.   All other systems reviewed and are negative. Blood pressure 110/68, pulse 76, temperature 97.6 F (36.4 C), temperature source Oral, resp. rate 16, height 5\' 3"  (1.6 m), weight 71.7 kg, SpO2 98 %. Body mass index is 27.99 kg/m.  Mental Status Per Nursing Assessment::   On Admission:  Suicidal ideation indicated by others, Suicide plan, Intention to act on suicide plan  Demographic Factors:  Caucasian  Loss Factors: NA  Historical Factors: Prior suicide attempts and Impulsivity  Risk Reduction Factors:   Responsible for children under 106 years of age, Sense of responsibility to family, Employed, Living with another person, especially a relative, Positive social support, and Positive coping skills or problem solving skills  Continued Clinical Symptoms:  Anxiety Bipolar Disorder:   Bipolar II   Cognitive Features That Contribute To Risk:  None    Suicide Risk:  Minimal acute risk: No identifiable suicidal ideation.  Patients presenting with no risk factors but with morbid ruminations; may be classified as  minimal risk based on the severity of the depressive symptoms  Follow-up Information     BEHAVIORAL HEALTH OUTPATIENT THERAPY [place]. Go on 10/27/2020.   Specialty: Behavioral Health Why: You have an appointment for therapy services on 10/27/20 at 8:30 am.  This appointment will be held in person. Contact information: [address] [phone] mc [place] [address] [phone]        [facility] Follow up on 11/10/2020.   Why: You have an appointment for medication management services on 11/10/20 at 10:00 am.  This appointment will be Virtual. Contact information: [address] [place] [address] [phone]         BEHAVIORAL HEALTH INTENSIVE CHEMICAL DEPENDENCY. Call.   Specialty: Behavioral Health Why: Please call this provider if you wish to schedule an appointment to begin chemical/substance abuse intensive outpatient therapy services. Contact information: [id number] mc [place] [address] [phone]                Plan Of Care/Follow-up recommendations:  Activity:  as tolerated  Tests:   -You will periodically need to have blood drawn for lab work to monitor blood sugar and cholesterol while you are taking Seroquel/quetiapine.   -Your outpatient doctor will let you know when lab work needs to be performed.  Other:   -Take medications as prescribed.   -Do not drink alcohol.  Do not use marijuana/cannabis or other drugs.  Attend outpatient substance abuse treatment program if you are unable to refrain from use of alcohol, marijuana or other substances on your own.   -Keep outpatient mental health follow-up appointments with therapist and psychiatrist.   -See your primary care provider for treatment of medical conditions.     Claudie Revering, MD 10/22/2020, 10:00 AM

## 2020-10-22 NOTE — Progress Notes (Signed)
   10/22/20 1000  Psych Admission Type (Psych Patients Only)  Admission Status Voluntary  Psychosocial Assessment  Patient Complaints Anxiety  Eye Contact Fair  Facial Expression Anxious  Affect Appropriate to circumstance  Speech Logical/coherent  Interaction Assertive  Motor Activity Other (Comment) (WDL)  Appearance/Hygiene Unremarkable  Thought Process  Coherency WDL  Content WDL  Delusions None reported or observed  Perception WDL  Hallucination None reported or observed  Judgment Poor  Confusion None  Danger to Self  Current suicidal ideation? Denies  Danger to Others  Danger to Others None reported or observed

## 2020-10-22 NOTE — Progress Notes (Signed)
Pt discharged to lobby. Husband present to take pt home.  Pt was stable and appreciative at that time. All papers, prescriptions were given and valuables returned. Verbal understanding expressed. Denies SI/HI and A/VH. Pt given opportunity to express concerns and ask questions.

## 2020-10-22 NOTE — Progress Notes (Signed)
  [facility] Adult Case Management Discharge Plan :  Will you be returning to the same living situation after discharge:  Yes,  Home  At discharge, do you have transportation home?: Yes,  Husband  Do you have the ability to pay for your medications: Yes,  Insurance  Release of information consent forms completed and in the chart;  Patient's signature needed at discharge.  Patient to Follow up at:  Follow-up Information     BEHAVIORAL HEALTH OUTPATIENT THERAPY [place]. Go on 10/27/2020.   Specialty: Behavioral Health Why: You have an appointment for therapy services on 10/27/20 at 8:30 am.  This appointment will be held in person. Contact information: [address] [phone] mc [place] [address] [phone]        [facility] Follow up on 11/10/2020.   Why: You have an appointment for medication management services on 11/10/20 at 10:00 am.  This appointment will be Virtual. Contact information: [address] [place] [address] [phone]         BEHAVIORAL HEALTH INTENSIVE CHEMICAL DEPENDENCY. Call.   Specialty: Behavioral Health Why: Please call this provider if you wish to schedule an appointment to begin chemical/substance abuse intensive outpatient therapy services. Contact information: [address] [phone] mc [place] [address] [phone]                Next level of care provider has access to [facility] Link:yes  Safety Planning and Suicide Prevention discussed: Yes,  with patient and husband      Has patient been referred to the Quitline?: Patient refused referral  Patient has been referred for addiction treatment: Yes  Aram Beecham, LCSWA 10/22/2020, 10:14 AM

## 2020-10-22 NOTE — Progress Notes (Signed)
Pt continue to complained about poor sleep at night, pt Seroquel was adjusted to 500 mg. Pt's hoping to sleep tonight. Pt has been pleasant and interact well with peers and staff. Denied SI/HI and contracted for safety, will continue to monitor.  10/21/20 2000  Psych Admission Type (Psych Patients Only)  Admission Status Voluntary  Psychosocial Assessment  Patient Complaints Insomnia  Eye Contact Fair  Facial Expression Anxious  Affect Appropriate to circumstance  Speech Logical/coherent  Interaction Assertive  Motor Activity Other (Comment) (WDL)  Appearance/Hygiene Unremarkable  Behavior Characteristics Cooperative;Calm  Mood Pleasant  Thought Process  Coherency WDL  Content WDL  Delusions None reported or observed  Perception WDL  Hallucination None reported or observed  Judgment Poor  Confusion None  Danger to Self  Current suicidal ideation? Denies  Danger to Others  Danger to Others None reported or observed

## 2020-10-27 ENCOUNTER — Ambulatory Visit (INDEPENDENT_AMBULATORY_CARE_PROVIDER_SITE_OTHER): Payer: BC Managed Care – PPO | Admitting: Licensed Clinical Social Worker

## 2020-10-27 ENCOUNTER — Other Ambulatory Visit: Payer: Self-pay

## 2020-10-27 DIAGNOSIS — F39 Unspecified mood [affective] disorder: Secondary | ICD-10-CM | POA: Diagnosis not present

## 2020-10-27 DIAGNOSIS — F4310 Post-traumatic stress disorder, unspecified: Secondary | ICD-10-CM
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
## 2020-11-01 ENCOUNTER — Telehealth (INDEPENDENT_AMBULATORY_CARE_PROVIDER_SITE_OTHER): Payer: BC Managed Care – PPO | Admitting: Psychiatry

## 2020-11-01 ENCOUNTER — Other Ambulatory Visit: Payer: Self-pay

## 2020-11-01 ENCOUNTER — Encounter (HOSPITAL_COMMUNITY): Payer: Self-pay | Admitting: Psychiatry

## 2020-11-01 DIAGNOSIS — F0630 Mood disorder due to known physiological condition, unspecified: Secondary | ICD-10-CM | POA: Diagnosis not present

## 2020-11-01 DIAGNOSIS — F1010 Alcohol abuse, uncomplicated: Secondary | ICD-10-CM | POA: Diagnosis not present

## 2020-11-01 DIAGNOSIS — F419 Anxiety disorder, unspecified: Secondary | ICD-10-CM

## 2020-11-01 DIAGNOSIS — F4310 Post-traumatic stress disorder, unspecified: Secondary | ICD-10-CM | POA: Diagnosis not present

## 2020-11-01 MED ORDER — QUETIAPINE FUMARATE 100 MG PO TABS: ORAL_TABLET | ORAL | 0 refills | Status: DC

## 2020-11-01 MED ORDER — GABAPENTIN 300 MG PO CAPS: 300.0000 mg | ORAL_CAPSULE | Freq: Three times a day (TID) | ORAL | 0 refills | Status: DC

## 2020-11-01 MED ORDER — BUPROPION HCL ER (XL) 150 MG PO TB24: 150.0000 mg | ORAL_TABLET | Freq: Every day | ORAL | 0 refills | Status: DC

## 2020-11-01 MED ORDER — SERTRALINE HCL 50 MG PO TABS: ORAL_TABLET | ORAL | 1 refills | Status: DC

## 2020-11-01 NOTE — Patient Instructions (Addendum)
Start sertraline 25 mg at night for one week, then 50 mg at night Continue  bupropion 150 mg daily Continue quetiapine 100 mg daily, 500 mg at night  Continue gabapentien 300 mg three times a day Discontinue lamotrigine  Next appointment- 10/6 at 8 AM, video 7. Please contact [facility] to make an appointment  Please call the following numbers if needed To schedule/cancel an appointment: [phone] To speak with nurse (about medication, forms or other concerns): [phone]

## 2020-11-10 ENCOUNTER — Ambulatory Visit (HOSPITAL_COMMUNITY): Payer: BC Managed Care – PPO | Admitting: Licensed Clinical Social Worker

## 2020-11-10 ENCOUNTER — Other Ambulatory Visit: Payer: Self-pay

## 2020-12-08 ENCOUNTER — Telehealth: Payer: Self-pay

## 2020-12-08 NOTE — Telephone Encounter (Signed)
Medication refill request - Fax from pt's [facility] in [place] requesting a new Sertraline order with the #60, for 2 a day as last ordered for only 30 + 1 refill.
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
## 2020-12-18 ENCOUNTER — Other Ambulatory Visit: Payer: Self-pay

## 2020-12-18 ENCOUNTER — Telehealth: Payer: Self-pay | Admitting: Psychiatry

## 2020-12-18 NOTE — Telephone Encounter (Signed)
Sent link for video visit through Epic. Patient did not sign in. Called the patient for appointment scheduled today. The patient did not answer the phone. Left voice message to contact the office ([phone]).   ?

## 2021-07-13 IMAGING — US US ABDOMEN LIMITED RUQ/ASCITES
1 series · 14 of 25 positions shown · non-contrast
Comparison: CT scan July 21, 2020.  MRI July 22, 2020.

CLINICAL DATA: Epigastric pain.  Elevated LFTs.

EXAM:
ULTRASOUND ABDOMEN LIMITED RIGHT UPPER QUADRANT

[Series 1: us abdomen limited · 14 of 36 slices shown]
[im 1/36]
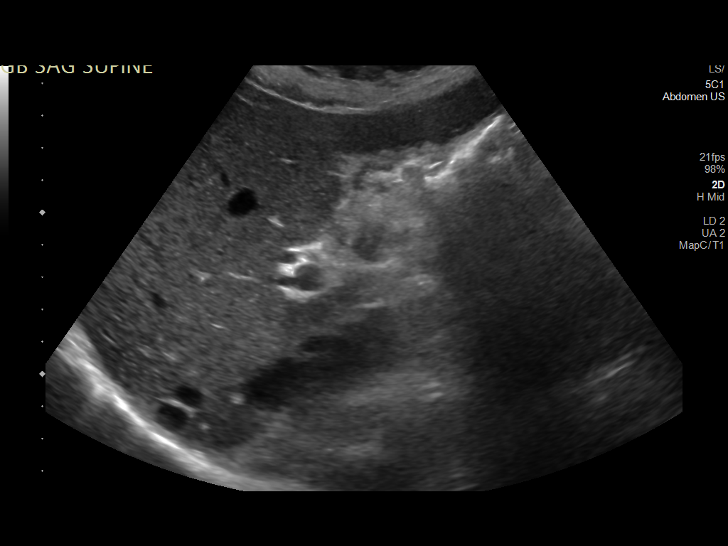
[im 3/36]
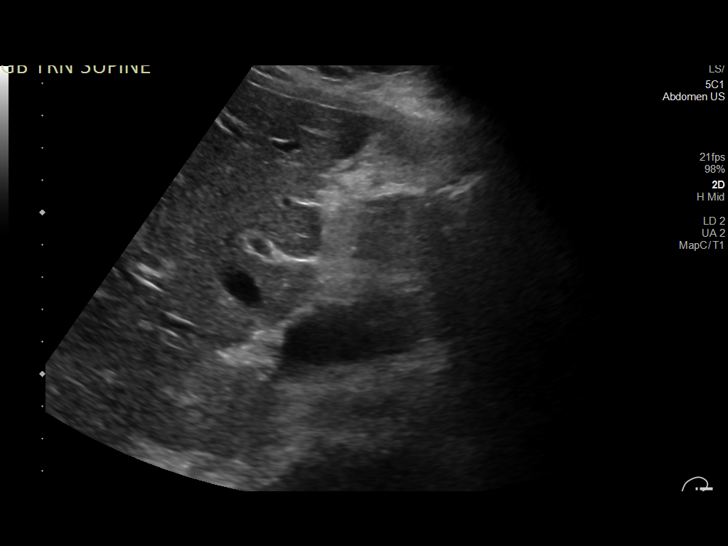
[im 6/36]
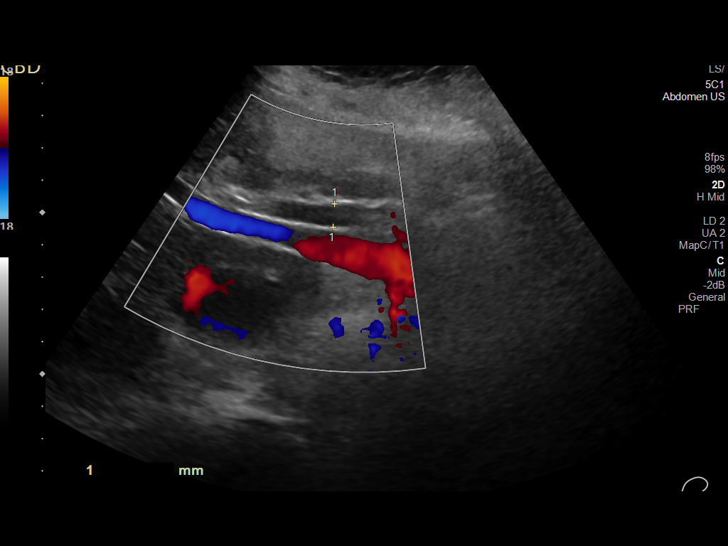
[im 9/36]
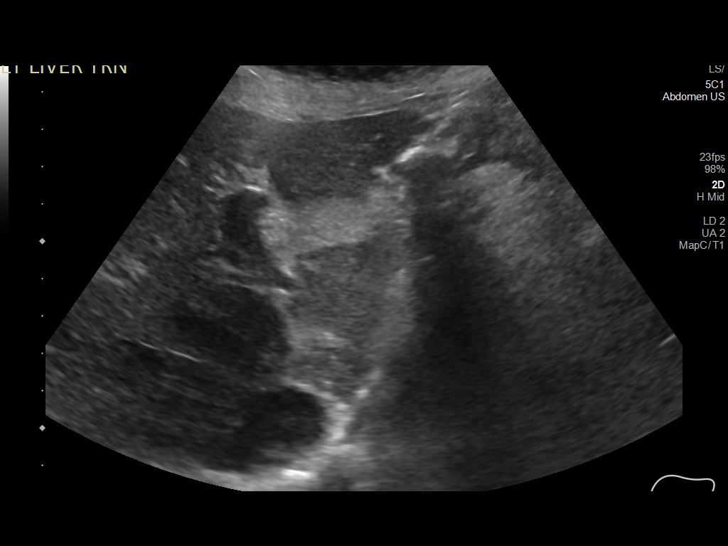
[im 12/36]
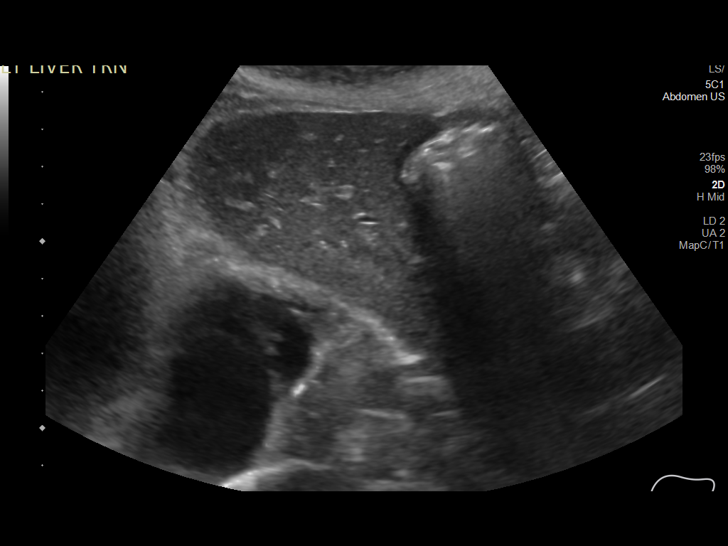
[im 14/36]
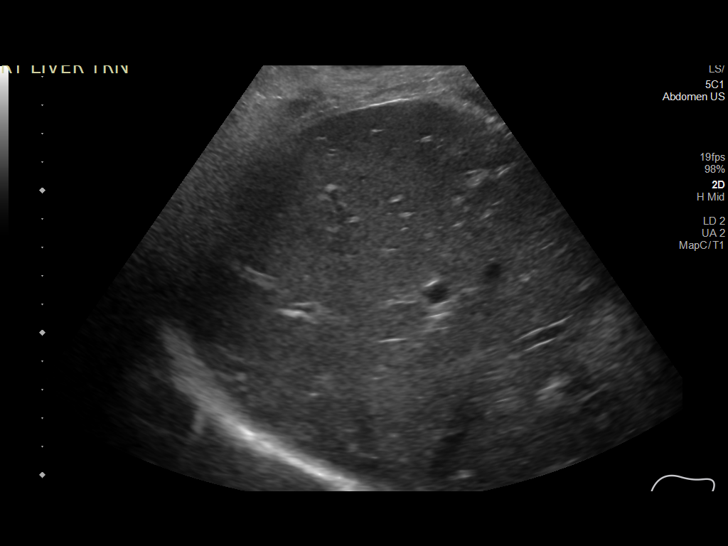
[im 17/36]
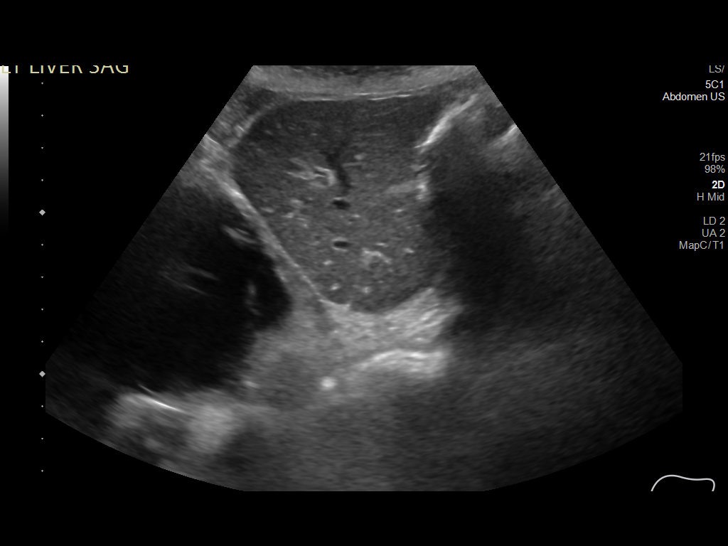
[im 19/36]
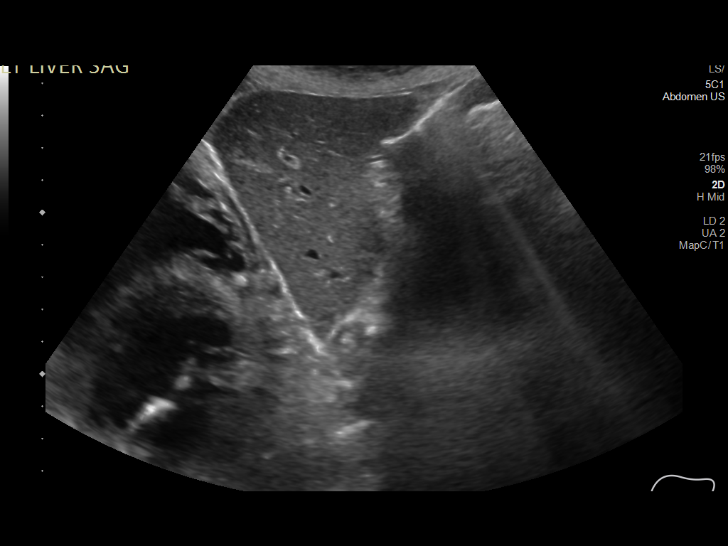
[im 22/36]
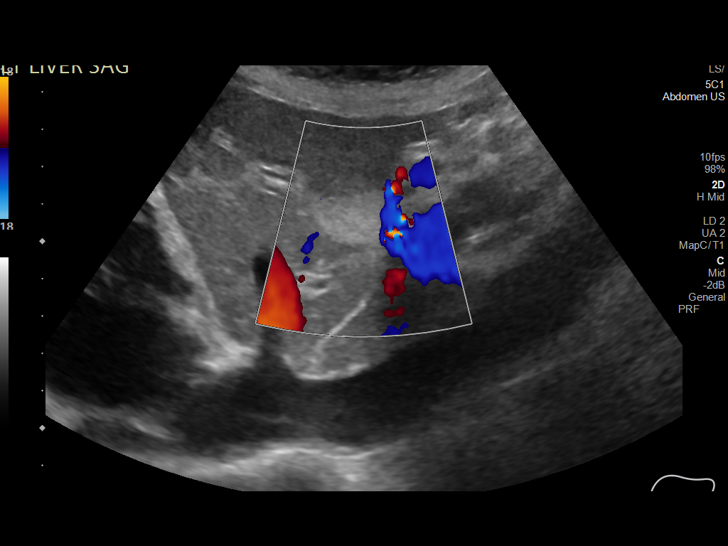
[im 24/36]
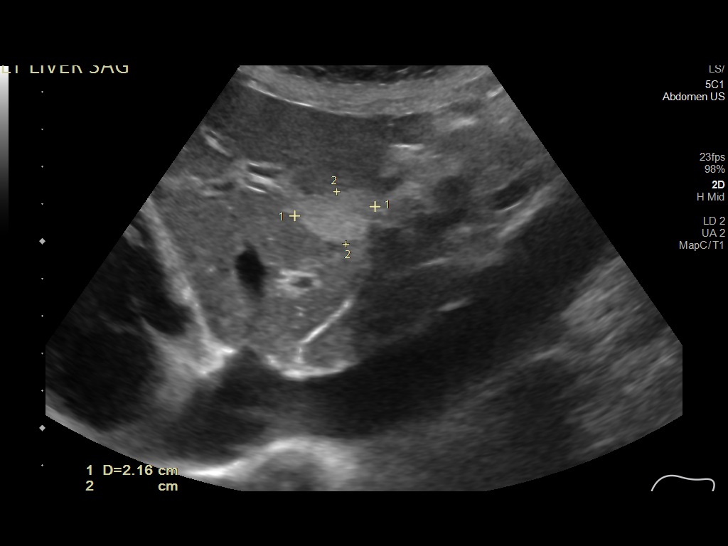
[im 27/36]
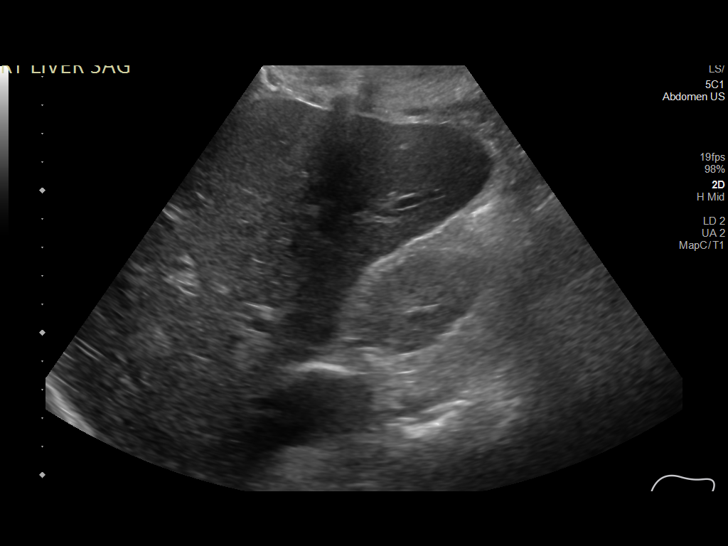
[im 30/36]
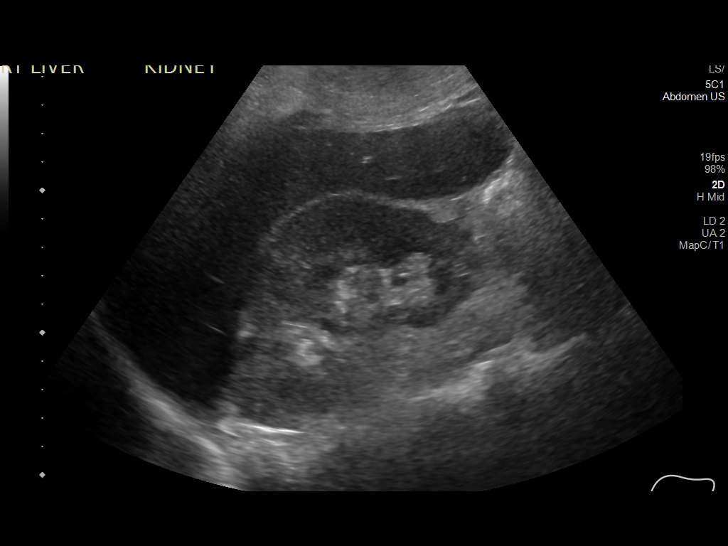
[im 33/36]
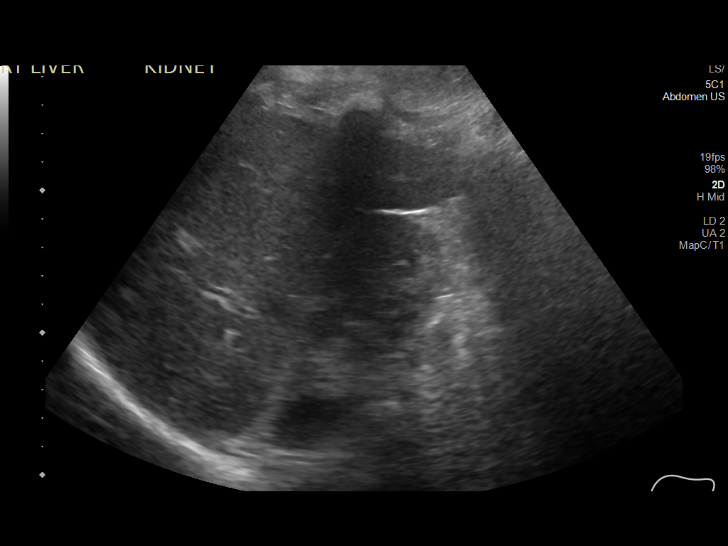
[im 36/36]
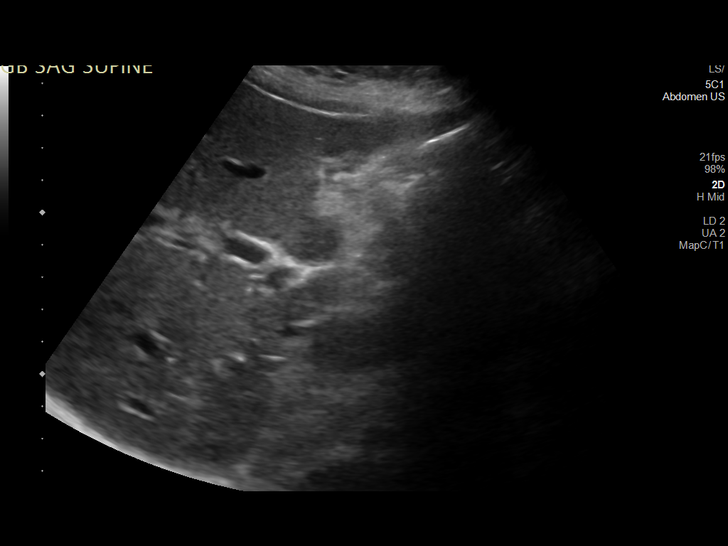

[14 of 25 positions shown; findings below may reference images not displayed]

FINDINGS: Gallbladder:

Surgically absent

Common bile duct:

Diameter: 7.28 mm

Liver:

There is a 2.2 x 1.4 x 2.2 cm hyperechoic masslike region which
correlates with the focal fatty deposition seen along the
undersurface of the liver on the July 22, 2020 MRI. No other
suspicious masses. Portal vein is patent on color Doppler imaging
with normal direction of blood flow towards the liver.

Other: None.
IMPRESSION: 1. The 2.2 x 1.4 x 2.2 cm hyperechoic masslike region along the
undersurface of the liver was noted to represent focal fatty
deposition on the comparison MRI.
2. Previous cholecystectomy.
3. The common bile duct is mildly dilated, consistent with previous
cholecystectomy.

## 2021-07-14 IMAGING — CT CT ABD-PELV W/O CM
2 of 4 series · 17 of 46 positions shown, 19 images · non-contrast
Comparison: MR of the abdomen from 3 days ago

CLINICAL DATA: Acute, nonlocalized abdominal pain

EXAM:
CT ABDOMEN AND PELVIS WITHOUT CONTRAST
TECHNIQUE: Multidetector CT imaging of the abdomen and pelvis was performed
following the standard protocol without IV contrast.

[Series 3: a/p w/o 5mm · axial · non-contrast · 0.77mm/px · z∈[-527,-127]mm · 14 of 88 slices shown, 16 images]
[im 4/88  soft-tissue]
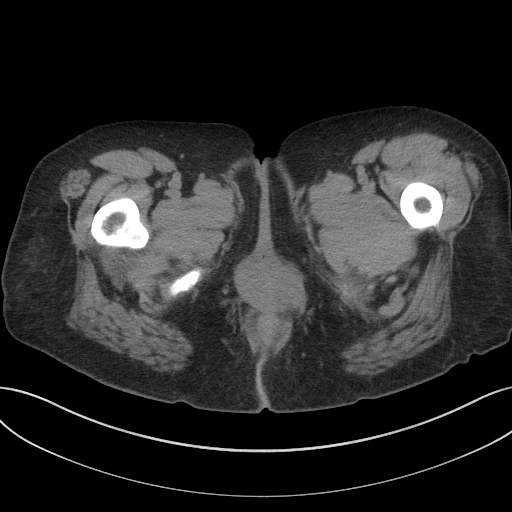
[im 4/88  bone]
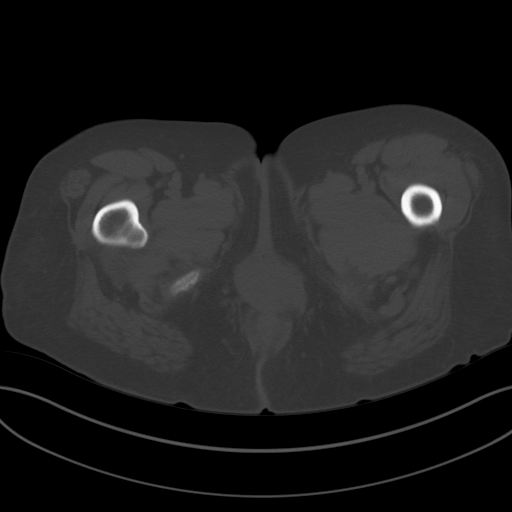
[im 11/88  soft-tissue]
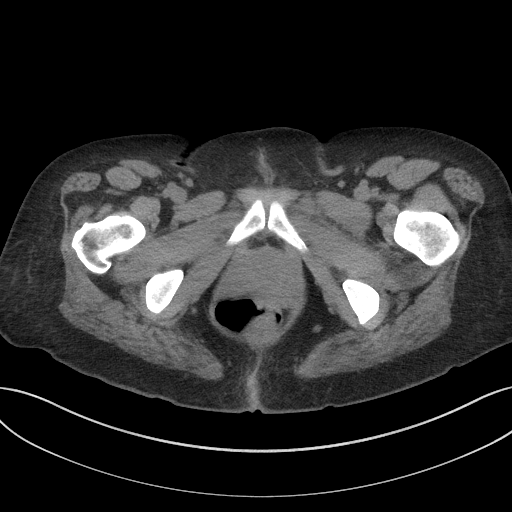
[im 19/88  soft-tissue]
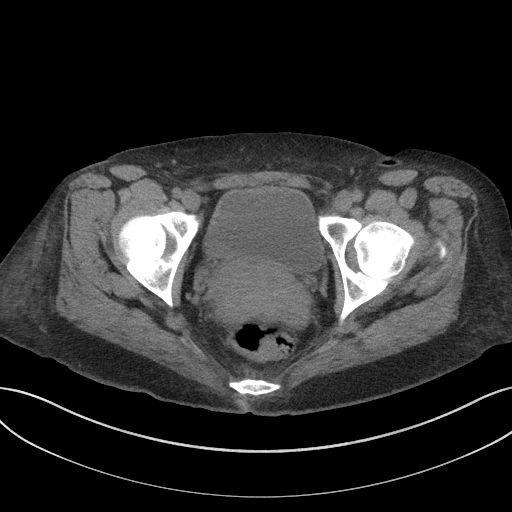
[im 22/88  soft-tissue]
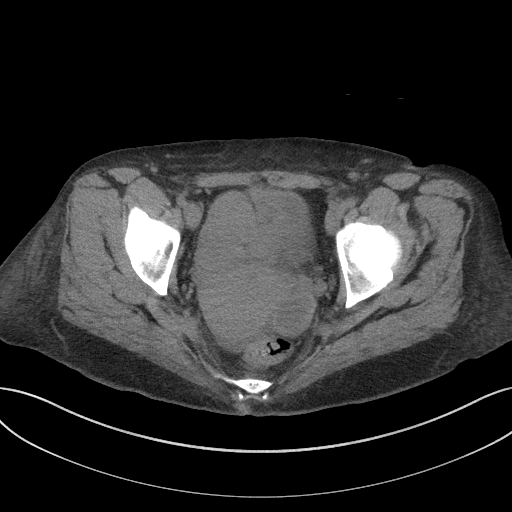
[im 30/88  soft-tissue]
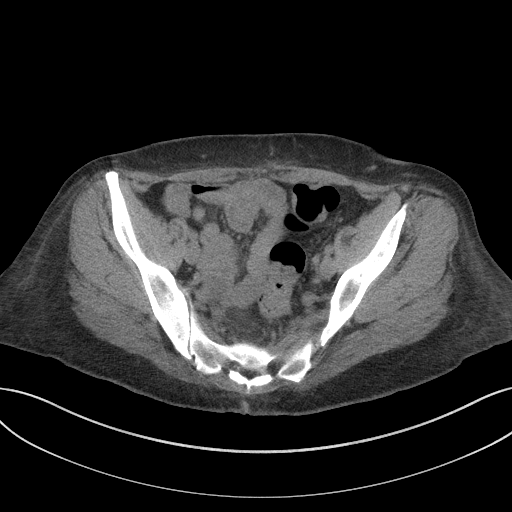
[im 37/88  soft-tissue]
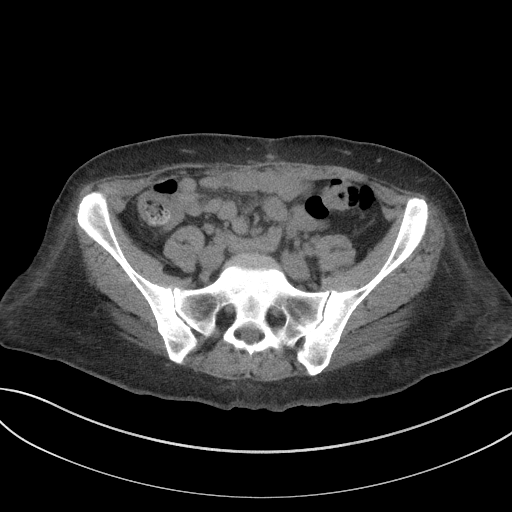
[im 40/88  soft-tissue]
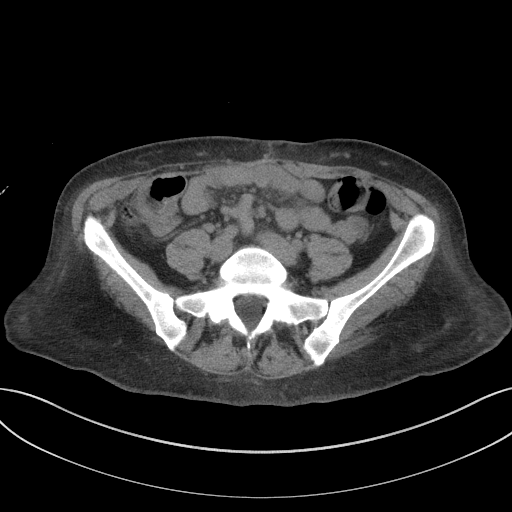
[im 48/88  soft-tissue]
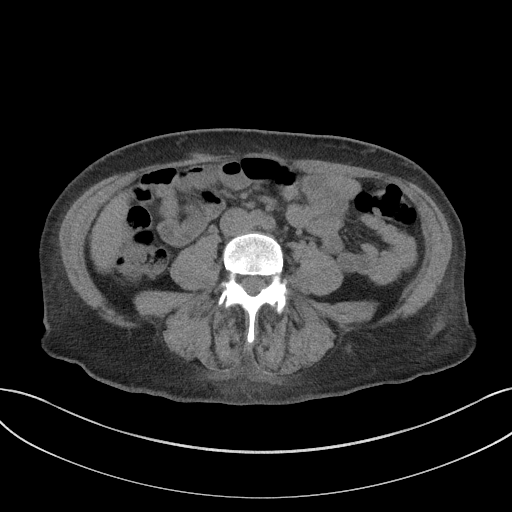
[im 51/88  soft-tissue]
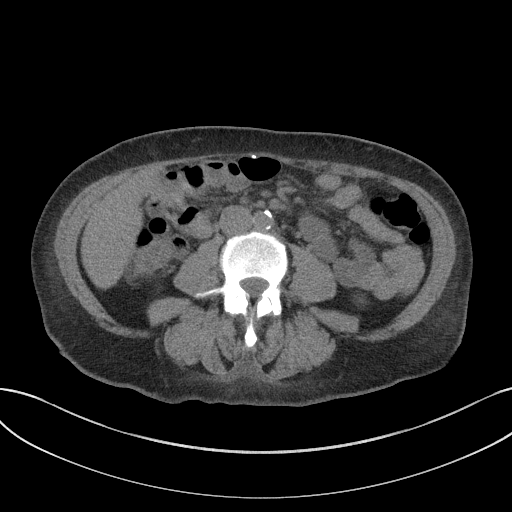
[im 51/88  bone]
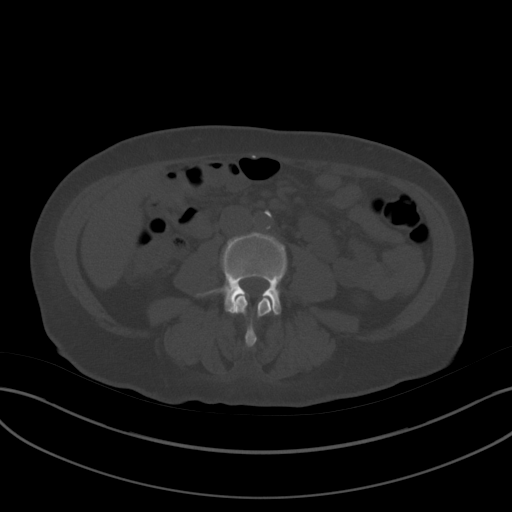
[im 59/88  soft-tissue]
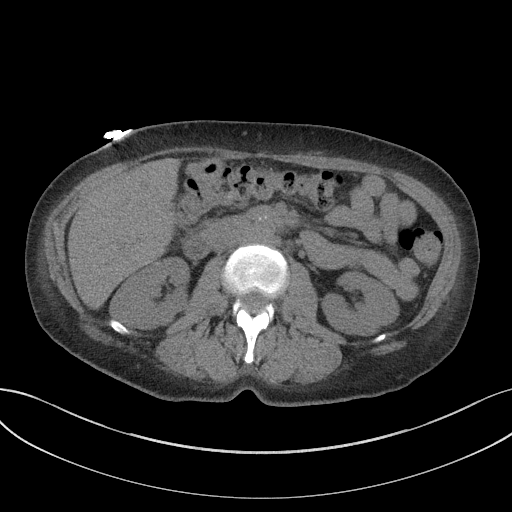
[im 66/88  soft-tissue]
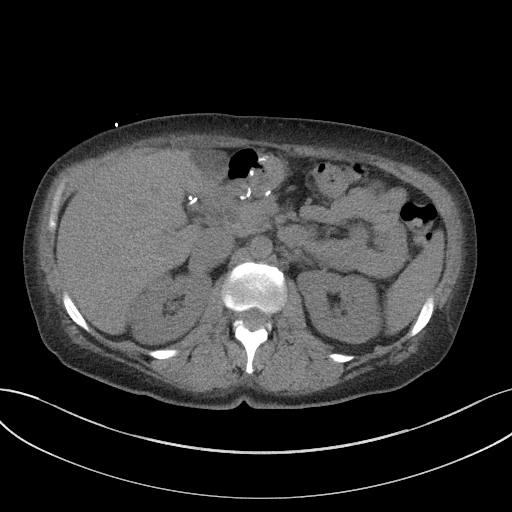
[im 69/88  soft-tissue]
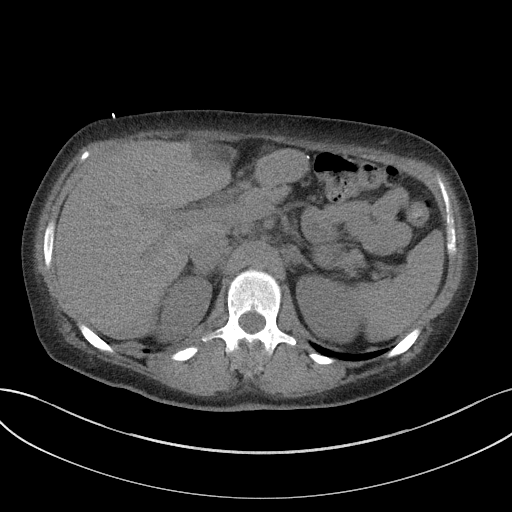
[im 77/88  soft-tissue]
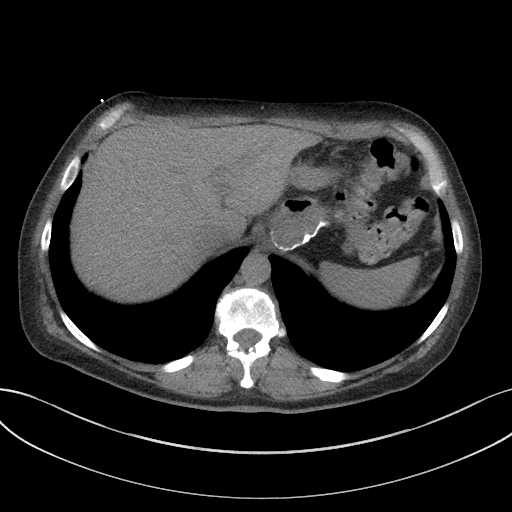
[im 84/88  soft-tissue]
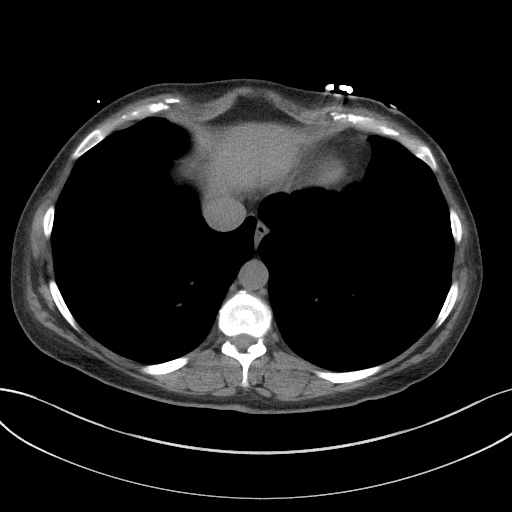

[Series 6: a/p w/o cor · coronal · non-contrast · 0.83mm/px · 3 of 147 slices shown]
[im 49/147  soft-tissue]
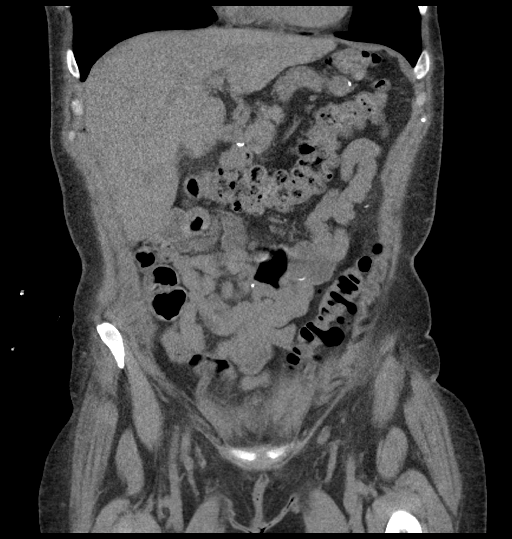
[im 65/147  soft-tissue]
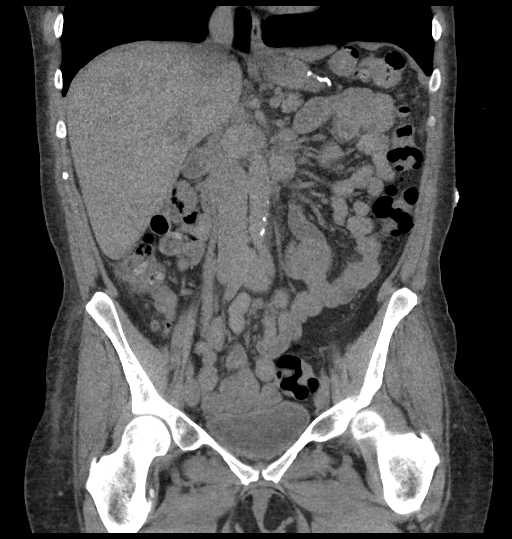
[im 82/147  soft-tissue]
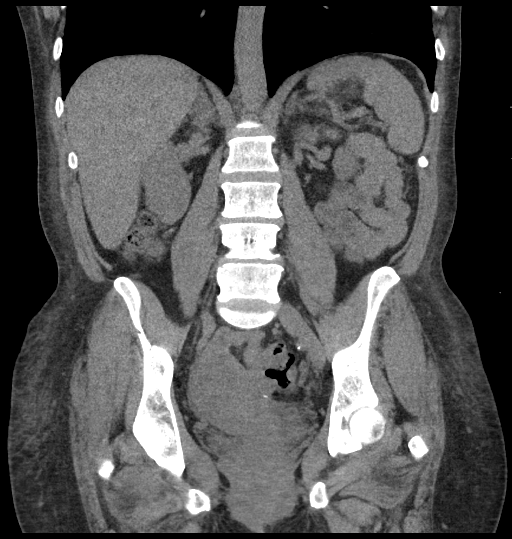

[17 of 46 positions shown; findings below may reference images not displayed]

FINDINGS: Lower chest:  Mild airway thickening at the bases

Hepatobiliary: Fatty infiltration in the inferior left lobe
liver.Cholecystectomy. No bile duct dilatation.

Pancreas: Unremarkable.

Spleen: Unremarkable.

Adrenals/Urinary Tract: Negative adrenals. No hydronephrosis or
stone. Unremarkable bladder.

Stomach/Bowel: Gastric bypass. No obstruction or visible marginal
inflammation. No abnormal stool retention.

Vascular/Lymphatic: Mild but notable for age atheromatous
calcification of the aorta. No mass or adenopathy.

Reproductive:No pathologic findings.

Other: No ascites or pneumoperitoneum.

Musculoskeletal: No acute abnormalities. Lower thoracic spondylosis
and lower lumbar facet osteoarthritis. L4-5 degenerative
anterolisthesis.
IMPRESSION: No acute finding.  No bowel obstruction or visible inflammation.

## 2022-08-27 ENCOUNTER — Other Ambulatory Visit: Payer: Self-pay

## 2022-08-27 ENCOUNTER — Encounter (HOSPITAL_COMMUNITY): Payer: Self-pay

## 2022-08-27 ENCOUNTER — Emergency Department (HOSPITAL_COMMUNITY): Payer: 59

## 2022-08-27 ENCOUNTER — Emergency Department (HOSPITAL_COMMUNITY)
Admission: EM | Admit: 2022-08-27 | Discharge: 2022-08-27 | Disposition: A | Discharge status: Home / Self Care | Payer: 59 | Attending: Emergency Medicine | Admitting: Emergency Medicine

## 2022-08-27 DIAGNOSIS — R200 Anesthesia of skin: Secondary | ICD-10-CM | POA: Diagnosis not present

## 2022-08-27 DIAGNOSIS — R079 Chest pain, unspecified: Secondary | ICD-10-CM | POA: Diagnosis present

## 2022-08-27 LAB — BASIC METABOLIC PANEL
Anion gap: 8 (ref 5–15)
BUN: 9 mg/dL (ref 6–20)
CO2: 22 mmol/L (ref 22–32)
Calcium: 8.3 mg/dL — ABNORMAL LOW (ref 8.9–10.3)
Chloride: 104 mmol/L (ref 98–111)
Creatinine, Ser: 0.67 mg/dL (ref 0.44–1.00)
GFR, Estimated: >60 mL/min (ref >60)
Glucose, Bld: 86 mg/dL (ref 70–99)
Potassium: 3.9 mmol/L (ref 3.5–5.1)
Sodium: 134 mmol/L — ABNORMAL LOW (ref 135–145)

## 2022-08-27 LAB — CBC
HCT: 35.6 % — ABNORMAL LOW (ref 36.0–46.0)
Hemoglobin: 11.4 g/dL — ABNORMAL LOW (ref 12.0–15.0)
MCH: 28.5 pg (ref 26.0–34.0)
MCHC: 32 g/dL (ref 30.0–36.0)
MCV: 89 fL (ref 80.0–100.0)
Platelets: 266 10*3/uL (ref 150–400)
RBC: 4 MIL/uL (ref 3.87–5.11)
RDW: 16.3 % — ABNORMAL HIGH (ref 11.5–15.5)
WBC: 7.1 10*3/uL (ref 4.0–10.5)
nRBC: 0 % (ref 0.0–0.2)

## 2022-08-27 LAB — I-STAT BETA HCG BLOOD, ED (MC, WL, AP ONLY): I-stat hCG, quantitative: <5 m[IU]/mL (ref <5)

## 2022-08-27 LAB — C-REACTIVE PROTEIN: CRP: 0.5 mg/dL (ref <1.0)

## 2022-08-27 LAB — VITAMIN B12: Vitamin B-12: 558 pg/mL (ref 180–914)

## 2022-08-27 LAB — SEDIMENTATION RATE: Sed Rate: 4 mm/hr (ref 0–22)

## 2022-08-27 LAB — TROPONIN I (HIGH SENSITIVITY)
Troponin I (High Sensitivity): 4 ng/L (ref <18)
Troponin I (High Sensitivity): 4 ng/L (ref <18)

## 2022-08-27 MED ORDER — GADOBUTROL 1 MMOL/ML IV SOLN
7.0000 mL | Freq: Once | INTRAVENOUS | Status: AC | PRN
  Administered 2022-08-27: 7 mL via INTRAVENOUS

## 2022-08-27 MED ORDER — GABAPENTIN 300 MG PO CAPS: 300.0000 mg | ORAL_CAPSULE | Freq: Two times a day (BID) | ORAL | 0 refills | Status: DC

## 2022-08-27 MED ORDER — OXYCODONE-ACETAMINOPHEN 5-325 MG PO TABS: 1.0000 | ORAL_TABLET | Freq: Four times a day (QID) | ORAL | 0 refills | Status: DC | PRN

## 2022-08-27 MED ORDER — FENTANYL CITRATE PF 50 MCG/ML IJ SOSY
50.0000 ug | PREFILLED_SYRINGE | Freq: Once | INTRAMUSCULAR | Status: AC
  Administered 2022-08-27: 50 ug via INTRAVENOUS
  Filled 2022-08-27: qty 1

## 2022-08-27 MED ORDER — DIAZEPAM 5 MG/ML IJ SOLN
5.0000 mg | Freq: Once | INTRAMUSCULAR | Status: AC | PRN
  Administered 2022-08-27: 5 mg via INTRAVENOUS
  Filled 2022-08-27: qty 2

## 2022-08-27 MED ORDER — HYDROMORPHONE HCL 1 MG/ML IJ SOLN: 1.0000 mg | Freq: Once | INTRAMUSCULAR | Status: DC

## 2022-08-27 MED ORDER — MORPHINE SULFATE (PF) 4 MG/ML IV SOLN
4.0000 mg | Freq: Once | INTRAVENOUS | Status: AC
  Administered 2022-08-27: 4 mg via INTRAVENOUS
  Filled 2022-08-27: qty 1

## 2022-08-27 MED ORDER — HYDROMORPHONE HCL 1 MG/ML IJ SOLN
1.0000 mg | Freq: Once | INTRAMUSCULAR | Status: AC
  Administered 2022-08-27: 1 mg via INTRAVENOUS
  Filled 2022-08-27: qty 1

## 2022-08-27 MED ORDER — OXYCODONE-ACETAMINOPHEN 5-325 MG PO TABS
2.0000 | ORAL_TABLET | Freq: Once | ORAL | Status: AC
  Administered 2022-08-27: 2 via ORAL
  Filled 2022-08-27: qty 2

## 2022-08-27 NOTE — Discharge Instructions (Signed)
As we discussed your heart enzyme tests are normal today.  Your MRI did not show a stroke  I have referred you to neurology for follow-up  I have also started you on gabapentin 300 mg twice a day  You also can take Percocet as needed for pain  Follow-up with your cardiologist for echo  Return to ER if you have worse chest pain or arm pain or numbness or weakness

## 2022-08-27 NOTE — ED Provider Notes (Signed)
[place] EMERGENCY DEPARTMENT AT [facility] Provider Note   CSN: [id number] Arrival date & time: 08/27/22  1507     History  Chief Complaint  Patient presents with   Lt Arm Numbness   Chest Pain   Shortness of Breath   Dizziness   Nausea   Emesis    Monique Floyd is a 42 y.o. female here presenting with left arm pain and weakness and chest pain.  This is a recurrent issue that is been going on for several weeks.  Patient apparently had tachycardia to the 190s.  Patient went to [place] on 6/5.  She had 2 negative troponins.  She went to follow-up with PCP and then had recurrent chest pain.  She then went to [facility] and had another evaluation.  She apparently had some numbness and weakness at that time and had an unremarkable MRI of the brain.  Patient went to see cardiology yesterday.  She had echo ordered outpatient.  Patient states that since yesterday she has some trouble swallowing.  She also has persistent left-sided arm numbness.  She went to PCP today was sent in for rule out stroke.  Of note she did have a CT chest that was done recently that showed some nonspecific subcutaneous tissues inflammation.  PCP was concerned for possible autoimmune process versus stroke versus ACS.  The history is provided by the patient.       Home Medications Prior to Admission medications   Medication Sig Start Date End Date Taking? Authorizing Provider  buPROPion (WELLBUTRIN XL) 150 MG 24 hr tablet Take 1 tablet (150 mg total) by mouth daily. For depression 11/22/20 12/22/20  Neysa Hotter, MD  Calcium Carb-Cholecalciferol (CALCIUM PLUS VITAMIN D) 500-200 MG-UNIT TABS Take 1 tablet by mouth 3 (three) times daily.    [provider]  calcium carbonate (TUMS - DOSED IN MG ELEMENTAL CALCIUM) 500 MG chewable tablet Chew 2 tablets (400 mg of elemental calcium total) by mouth 3 (three) times daily. (May buy from over the counter): For heart burn 10/22/20   Armandina Stammer I, NP  Cyanocobalamin (B-12 SUPER STRENGTH) 5000 MCG/ML LIQD Place 5,000 mcg under the tongue 2 (two) times daily.    [provider]  Ferrous Gluconate-C-Folic Acid (IRON-C PO) Take 1 tablet by mouth at bedtime.    [provider]  gabapentin (NEURONTIN) 300 MG capsule Take 1 capsule (300 mg total) by mouth 3 (three) times daily. For agitation/substance withdrawal syndrome 11/22/20   Neysa Hotter, MD  nicotine (NICODERM CQ - DOSED IN MG/24 HOURS) 21 mg/24hr patch Place 1 patch (21 mg total) onto the skin daily at 6 (six) AM. (May buy from over the counter): For smoking cessation 10/23/20   Armandina Stammer I, NP  oxyCODONE-acetaminophen (PERCOCET/ROXICET) 5-325 MG tablet Take 1 tablet by mouth every 6 (six) hours as needed for moderate pain ((4-6)). Take for up to 3 days 08/24/22 08/27/22  [provider]  QUEtiapine (SEROQUEL) 100 MG tablet Take 1 tablet (100 mg total) by mouth daily. For mood control 10/23/20   Armandina Stammer I, NP  QUEtiapine (SEROQUEL) 100 MG tablet 100 mg daily and 500 mg at night 11/22/20   Neysa Hotter, MD  sertraline (ZOLOFT) 50 MG tablet 25 mg at night for one week, then 50 mg at night 11/01/20   Hisada, Barbee Cough, MD      Allergies    Bee venom, Codeine, Iodinated contrast media, and Hydromorphone    Review of Systems  Review of Systems  Respiratory:  Positive for shortness of breath.   Cardiovascular:  Positive for chest pain.  Gastrointestinal:  Positive for vomiting.  Neurological:  Positive for dizziness.  All other systems reviewed and are negative.   Physical Exam Updated Vital Signs BP (!) 104/56   Pulse (!) 52   Temp 98.9 F (37.2 C) (Oral)   Resp (!) 23   Ht 5\' 2"  (1.575 m)   Wt 71.7 kg   SpO2 98%   BMI 28.90 kg/m  Physical Exam Vitals and nursing note reviewed.  Constitutional:      Comments: Anxious   HENT:     Head: Normocephalic.  Eyes:     Extraocular Movements: Extraocular movements intact.     Pupils: Pupils are  equal, round, and reactive to light.  Cardiovascular:     Rate and Rhythm: Normal rate and regular rhythm.     Heart sounds: Normal heart sounds.  Pulmonary:     Effort: Pulmonary effort is normal.     Breath sounds: Normal breath sounds.  Abdominal:     General: Bowel sounds are normal.     Palpations: Abdomen is soft.  Musculoskeletal:        General: Normal range of motion.     Cervical back: Normal range of motion and neck supple.  Skin:    General: Skin is warm.  Neurological:     General: No focal deficit present.     Mental Status: She is alert and oriented to person, place, and time.  Psychiatric:        Mood and Affect: Mood normal.        Behavior: Behavior normal.     ED Results / Procedures / Treatments   Labs (all labs ordered are listed, but only abnormal results are displayed) Labs Reviewed  BASIC METABOLIC PANEL - Abnormal; Notable for the following components:      Result Value   Sodium 134 (*)    Calcium 8.3 (*)    All other components within normal limits  CBC - Abnormal; Notable for the following components:   Hemoglobin 11.4 (*)    HCT 35.6 (*)    RDW 16.3 (*)    All other components within normal limits  SEDIMENTATION RATE  C-REACTIVE PROTEIN  ANA W/REFLEX IF POSITIVE  VITAMIN B12  I-STAT BETA HCG BLOOD, ED ([facility], [facility], [facility] ONLY)  TROPONIN I (HIGH SENSITIVITY)  TROPONIN I (HIGH SENSITIVITY)    EKG EKG Interpretation  Date/Time:  Friday August 27 2022 16:00:49 EDT Ventricular Rate:  65 PR Interval:  144 QRS Duration: 84 QT Interval:  388 QTC Calculation: 403 R Axis:   -4 Text Interpretation: Normal sinus rhythm Normal ECG When compared with ECG of 15-Oct-2020 05:51, PREVIOUS ECG IS PRESENT Confirmed by Richardean Canal [phone]) on 08/27/2022 4:21:43 PM  Radiology MR Brain W and Wo Contrast  Result Date: 08/27/2022 CLINICAL DATA:  Left-sided weakness, neck pain, stroke or multiple sclerosis suspected EXAM: MRI HEAD WITHOUT AND WITH CONTRAST MRI  CERVICAL SPINE WITHOUT AND WITH CONTRAST TECHNIQUE: Multiplanar, multiecho pulse sequences of the brain and surrounding structures, and cervical spine, to include the craniocervical junction and cervicothoracic junction, were obtained without and with intravenous contrast. CONTRAST:  7mL GADAVIST GADOBUTROL 1 MMOL/ML IV SOLN COMPARISON:  No prior MRI head or cervical spine available FINDINGS: MRI HEAD FINDINGS Brain: No restricted diffusion to suggest acute or subacute infarct. No abnormal parenchymal or meningeal enhancement. No abnormal T2 hyperintense signal in  the periventricular, juxtacortical, or infratentorial white matter. No acute hemorrhage, mass, mass effect, or midline shift. No hydrocephalus or extra-axial collection. Normal pituitary and craniocervical junction. No hemosiderin deposition to suggest remote hemorrhage. Normal cerebral volume for age. Vascular: Normal arterial flow voids. Skull and upper cervical spine: Normal marrow signal. Sinuses/Orbits: Clear paranasal sinuses. No acute finding in the orbits. Other: Trace fluid in the mastoid air cells. MRI CERVICAL SPINE FINDINGS Alignment: Straightening of the normal cervical lordosis. No significant listhesis. Vertebrae: No acute fracture, evidence of discitis, or suspicious osseous lesion. No abnormal enhancement. Cord: Normal signal and morphology.  No abnormal enhancement. Posterior Fossa, vertebral arteries, paraspinal tissues: Negative. Disc levels: C2-C3: No significant disc bulge. No spinal canal stenosis or neuroforaminal narrowing. C3-C4: No significant disc bulge. No spinal canal stenosis or neuroforaminal narrowing. C4-C5: No significant disc bulge. Left facet arthropathy. No spinal canal stenosis. Mild left neural foraminal narrowing. C5-C6: No significant disc bulge. No spinal canal stenosis or neuroforaminal narrowing. C6-C7: No significant disc bulge. No spinal canal stenosis or neuroforaminal narrowing. C7-T1: No significant disc  bulge. No spinal canal stenosis or neuroforaminal narrowing. IMPRESSION: 1. No acute intracranial process. No evidence of acute or subacute infarct. 2. C4-C5 mild left neural foraminal narrowing. No spinal canal stenosis. 3. No evidence of demyelinating disease in the brain or cervical spinal cord. Electronically Signed   By: Wiliam Ke M.D.   On: 08/27/2022 19:57   MR Cervical Spine W and Wo Contrast  Result Date: 08/27/2022 CLINICAL DATA:  Left-sided weakness, neck pain, stroke or multiple sclerosis suspected EXAM: MRI HEAD WITHOUT AND WITH CONTRAST MRI CERVICAL SPINE WITHOUT AND WITH CONTRAST TECHNIQUE: Multiplanar, multiecho pulse sequences of the brain and surrounding structures, and cervical spine, to include the craniocervical junction and cervicothoracic junction, were obtained without and with intravenous contrast. CONTRAST:  7mL GADAVIST GADOBUTROL 1 MMOL/ML IV SOLN COMPARISON:  No prior MRI head or cervical spine available FINDINGS: MRI HEAD FINDINGS Brain: No restricted diffusion to suggest acute or subacute infarct. No abnormal parenchymal or meningeal enhancement. No abnormal T2 hyperintense signal in the periventricular, juxtacortical, or infratentorial white matter. No acute hemorrhage, mass, mass effect, or midline shift. No hydrocephalus or extra-axial collection. Normal pituitary and craniocervical junction. No hemosiderin deposition to suggest remote hemorrhage. Normal cerebral volume for age. Vascular: Normal arterial flow voids. Skull and upper cervical spine: Normal marrow signal. Sinuses/Orbits: Clear paranasal sinuses. No acute finding in the orbits. Other: Trace fluid in the mastoid air cells. MRI CERVICAL SPINE FINDINGS Alignment: Straightening of the normal cervical lordosis. No significant listhesis. Vertebrae: No acute fracture, evidence of discitis, or suspicious osseous lesion. No abnormal enhancement. Cord: Normal signal and morphology.  No abnormal enhancement. Posterior  Fossa, vertebral arteries, paraspinal tissues: Negative. Disc levels: C2-C3: No significant disc bulge. No spinal canal stenosis or neuroforaminal narrowing. C3-C4: No significant disc bulge. No spinal canal stenosis or neuroforaminal narrowing. C4-C5: No significant disc bulge. Left facet arthropathy. No spinal canal stenosis. Mild left neural foraminal narrowing. C5-C6: No significant disc bulge. No spinal canal stenosis or neuroforaminal narrowing. C6-C7: No significant disc bulge. No spinal canal stenosis or neuroforaminal narrowing. C7-T1: No significant disc bulge. No spinal canal stenosis or neuroforaminal narrowing. IMPRESSION: 1. No acute intracranial process. No evidence of acute or subacute infarct. 2. C4-C5 mild left neural foraminal narrowing. No spinal canal stenosis. 3. No evidence of demyelinating disease in the brain or cervical spinal cord. Electronically Signed   By: Wiliam Ke M.D.   On: 08/27/2022  19:57   DG Chest 2 View  Result Date: 08/27/2022 CLINICAL DATA:  Chest pain EXAM: CHEST - 2 VIEW COMPARISON:  Chest x-ray dated September 22, 2022 FINDINGS: The heart size and mediastinal contours are within normal limits. Both lungs are clear. The visualized skeletal structures are unremarkable. IMPRESSION: Lungs are clear. Electronically Signed   By: Allegra Lai M.D.   On: 08/27/2022 16:48    Procedures Procedures    Medications Ordered in ED Medications  morphine (PF) 4 MG/ML injection 4 mg (4 mg Intravenous Given 08/27/22 1741)  diazepam (VALIUM) injection 5 mg (5 mg Intravenous Given 08/27/22 1815)  HYDROmorphone (DILAUDID) injection 1 mg (1 mg Intravenous Given 08/27/22 1816)  gadobutrol (GADAVIST) 1 MMOL/ML injection 7 mL (7 mLs Intravenous Contrast Given 08/27/22 1921)  fentaNYL (SUBLIMAZE) injection 50 mcg (50 mcg Intravenous Given 08/27/22 2100)    ED Course/ Medical Decision Making/ A&P                             Medical Decision Making Monique Floyd is a 42 y.o.  female here presenting with multiple complaints.  Patient had left-sided chest pain as well as left-sided numbness and trouble swallowing.  Consider stroke versus multiple sclerosis, low risk for ACS.  Plan to get MRI brain and cervical spine.  Will also get lab work including troponin  9:32 PM I reviewed patient's labs and white blood cell count is normal.  Inflammatory markers are negative.  MRI brain and cervical spine unremarkable.  There is no signs of multiple sclerosis.  I discussed case with Dr. Amada Jupiter from neurology.  He recommend outpatient neurology follow-up.  Patient also has cardiology follow-up and echo scheduled.  Patient stable for discharge.  Will try gabapentin   Problems Addressed: Chest pain, unspecified type: acute illness or injury Numbness: acute illness or injury  Amount and/or Complexity of Data Reviewed Labs: ordered. Decision-making details documented in ED Course. Radiology: ordered and independent interpretation performed. Decision-making details documented in ED Course. ECG/medicine tests: ordered and independent interpretation performed. Decision-making details documented in ED Course.  Risk Prescription drug management.    Final Clinical Impression(s) / ED Diagnoses Final diagnoses:  None    Rx / DC Orders ED Discharge Orders     None         Charlynne Pander, MD 08/27/22 2133

## 2022-08-27 NOTE — ED Notes (Signed)
Patient transported to MRI 

## 2022-08-27 NOTE — ED Triage Notes (Addendum)
Pt BIB [facility] from her Cardiologist office today with a recommendation for a work-up regarding her s/s she has had since Wednesday last week (08/18/22). Pt reports she was at work when her watch alerted her of a 193 bpm pulse she then began feeling intermittent CP/heaviness for about an hr then it began radiating into her Lt shoulder, down her arm & into her jaw line plus feeling dizzy & SOB during that moment. She then reports having a syncopal event & she was sent to [facility] via EMS. She continues having this CP so was then seen at [facility] ER this past Monday (08/22/22). She went to a Cardiologist yesterday & he recommended her to go to PCP & she saw him today & then came straight here from his office. Pt reports upon arrival to ED than she still has consistent CP rated 9/10 & still feeling hard to breathe, (spO2 100% on RA while in triage). On top of that she reports beginning to have Lt arm weakness that started 2 days ago & explains it as debilitating. Pt relays that she was informed one of her chest xrays showed a "outer chest cavity wall inflammation" (per pt). Denies fevers, some n/v, A/Ox4. EDP Pickering informed of this pt.

## 2022-08-30 LAB — ANA W/REFLEX IF POSITIVE: Anti Nuclear Antibody (ANA): NEGATIVE

## 2022-09-03 LAB — LAB REPORT - SCANNED: EGFR: 113.3

## 2022-09-30 ENCOUNTER — Ambulatory Visit: Payer: 59 | Admitting: Neurology

## 2022-09-30 ENCOUNTER — Encounter: Payer: Self-pay | Admitting: Neurology

## 2022-09-30 VITALS — BP 118/74 | HR 62 | Ht 62.0 in | Wt 138.0 lb

## 2022-09-30 DIAGNOSIS — R29898 Other symptoms and signs involving the musculoskeletal system: Secondary | ICD-10-CM | POA: Diagnosis not present

## 2022-09-30 DIAGNOSIS — F447 Conversion disorder with mixed symptom presentation: Secondary | ICD-10-CM

## 2022-09-30 NOTE — Progress Notes (Unsigned)
[facility]  PATIENT: Monique Floyd DOB: Jun 01, 1980  REQUESTING CLINICIAN: Tetter, Alma Downs, NP HISTORY FROM: Patient, husband and chart review  REASON FOR VISIT: Left side weakness    HISTORICAL  CHIEF COMPLAINT:  Chief Complaint  Patient presents with   New Patient (Initial Visit)    Rm 12. Patient with husband, been having weakness on left side. Now uses cane and wheelchair. Episode where left side went numb with no coherence. Had imaging in ER at three hospital, but no imaging showed a stroke. Patient reports left side burns and feels on fire. Confusion comes and goes. Sometimes reports twitches on left side.     HISTORY OF PRESENT ILLNESS:  This is a 42 year old woman past medical history bipolar disorder, anxiety, depression, headaches who is presenting with complaint of left-sided weakness for the past month.  Patient report left-sided weakness involving the left arm and left leg, she reported due to the weakness she cannot walk and has to use a wheelchair or walker.  She also complains of pain that she describes as burning "all inside on fire".  An occasional tremor.  She was seen in 2 different hospital in the past couple month, had MRI which was negative for any acute stroke or any cervical spine disease.  She reports following up with her PCP who diagnosed her with lupus, there is also a diagnosis of liver disorder and she is pending ultrasound of the liver. She is also complaining of chest heaviness, left side pain, speech difficulty, passing out with left side convulsion  OTHER MEDICAL CONDITIONS: Bipolar disorder, Anxiety/Depression    REVIEW OF SYSTEMS: Full 14 system review of systems performed and negative with exception of: As noted in the HPI   ALLERGIES: Allergies  Allergen Reactions   Bee Venom Shortness Of Breath and Swelling   Codeine Nausea And Vomiting and Other (See Comments)   Iodinated Contrast Media Anaphylaxis and Swelling     PT. AND HUSBAND STATED THAT HER THROAT SWELLS SHUT WHEN SHE HAD IV CONTRAST.   Hydromorphone Nausea And Vomiting    HOME MEDICATIONS: Outpatient Medications Prior to Visit  Medication Sig Dispense Refill   Calcium Carb-Cholecalciferol (CALCIUM PLUS VITAMIN D) 500-200 MG-UNIT TABS Take 1 tablet by mouth 3 (three) times daily.     calcium carbonate (TUMS - DOSED IN MG ELEMENTAL CALCIUM) 500 MG chewable tablet Chew 2 tablets (400 mg of elemental calcium total) by mouth 3 (three) times daily. (May buy from over the counter): For heart burn 1 tablet 0   Cyanocobalamin (B-12 SUPER STRENGTH) 5000 MCG/ML LIQD Place 5,000 mcg under the tongue 2 (two) times daily.     Ferrous Gluconate-C-Folic Acid (IRON-C PO) Take 1 tablet by mouth at bedtime.     gabapentin (NEURONTIN) 300 MG capsule Take 1 capsule (300 mg total) by mouth 3 (three) times daily. For agitation/substance withdrawal syndrome 90 capsule 0   gabapentin (NEURONTIN) 300 MG capsule Take 1 capsule (300 mg total) by mouth 2 (two) times daily. 30 capsule 0   buPROPion (WELLBUTRIN XL) 150 MG 24 hr tablet Take 1 tablet (150 mg total) by mouth daily. For depression 30 tablet 0   nicotine (NICODERM CQ - DOSED IN MG/24 HOURS) 21 mg/24hr patch Place 1 patch (21 mg total) onto the skin daily at 6 (six) AM. (May buy from over the counter): For smoking cessation (Patient not taking: Reported on 09/30/2022) 1 patch 0   oxyCODONE-acetaminophen (PERCOCET/ROXICET) 5-325 MG tablet Take 1 tablet by mouth  every 6 (six) hours as needed for severe pain. (Patient not taking: Reported on 09/30/2022) 10 tablet 0   QUEtiapine (SEROQUEL) 50 MG tablet Take 50 mg by mouth at bedtime. (Patient not taking: Reported on 09/30/2022)     sertraline (ZOLOFT) 50 MG tablet 25 mg at night for one week, then 50 mg at night (Patient not taking: Reported on 09/30/2022) 30 tablet 1   No facility-administered medications prior to visit.    PAST MEDICAL HISTORY: Past Medical History:   Diagnosis Date   Abnormal Pap smear    Anxiety    Asthma    Headache    Prior pregnancy complicated by PIH, antepartum     PAST SURGICAL HISTORY: Past Surgical History:  Procedure Laterality Date   CESAREAN SECTION     EYE SURGERY      FAMILY HISTORY: Family History  Problem Relation Age of Onset   Drug abuse Paternal Aunt    Dementia Paternal Grandmother     SOCIAL HISTORY: Social History   Socioeconomic History   Marital status: Married    Spouse name: Not on file   Number of children: Not on file   Years of education: Not on file   Highest education level: Not on file  Occupational History   Not on file  Tobacco Use   Smoking status: Former    Current packs/day: 0.00    Types: Cigarettes    Quit date: 06/14/2011    Years since quitting: 11.3   Smokeless tobacco: Not on file  Vaping Use   Vaping status: Some Days  Substance and Sexual Activity   Alcohol use: Yes    Alcohol/week: 2.0 standard drinks of alcohol    Types: 1 Glasses of wine, 1 Standard drinks or equivalent per week   Drug use: Not Currently   Sexual activity: Yes  Other Topics Concern   Not on file  Social History Narrative   Not on file   Social Determinants of Health   Financial Resource Strain: Not on file  Food Insecurity: Not on file  Transportation Needs: Not on file  Physical Activity: Not on file  Stress: Not on file  Social Connections: Not on file  Intimate Partner Violence: Not on file    PHYSICAL EXAM  GENERAL EXAM/CONSTITUTIONAL: Vitals:  Vitals:   09/30/22 1434  BP: 118/74  Pulse: 62  Weight: 138 lb (62.6 kg)  Height: 5\' 2"  (1.575 m)   Body mass index is 25.24 kg/m. Wt Readings from Last 3 Encounters:  09/30/22 138 lb (62.6 kg)  08/27/22 158 lb (71.7 kg)  10/14/20 158 lb (71.7 kg)   Patient is in no distress; well developed, nourished and groomed; neck is supple  MUSCULOSKELETAL: Gait, strength, tone, movements noted in Neurologic exam  below  NEUROLOGIC: MENTAL STATUS:      No data to display         awake, alert, oriented to person, place and time recent and remote memory intact normal attention and concentration language fluent, comprehension intact, naming intact fund of knowledge appropriate  CRANIAL NERVE:  2nd, 3rd, 4th, 6th - Visual fields full to confrontation, extraocular muscles intact, no nystagmus 5th - facial sensation symmetric 7th - facial strength symmetric 8th - hearing intact 9th - palate elevates symmetrically, uvula midline 11th - shoulder shrug symmetric 12th - tongue protrusion midline  MOTOR:  normal bulk and tone, 4/5 a strength in the LUE and LLE. There is drift on the left arm but no pronation  SENSORY:  Report decrease sensation to light touch 50% in the LUE and LLE.   COORDINATION:  finger-nose-finger, fine finger movements normal  REFLEXES:  deep tendon reflexes present and symmetric  GAIT/STATION:  Deferred, in a wheelchair     DIAGNOSTIC DATA (LABS, IMAGING, TESTING) - I reviewed patient records, labs, notes, testing and imaging myself where available.  Lab Results  Component Value Date   WBC 7.1 08/27/2022   HGB 11.4 (L) 08/27/2022   HCT 35.6 (L) 08/27/2022   MCV 89.0 08/27/2022   PLT 266 08/27/2022      Component Value Date/Time   NA 134 (L) 08/27/2022 1610   K 3.9 08/27/2022 1610   CL 104 08/27/2022 1610   CO2 22 08/27/2022 1610   GLUCOSE 86 08/27/2022 1610   BUN 9 08/27/2022 1610   CREATININE 0.67 08/27/2022 1610   CALCIUM 8.3 (L) 08/27/2022 1610   PROT 4.8 (L) 07/31/2020 0327   ALBUMIN 2.8 (L) 07/31/2020 0327   AST 24 07/31/2020 0327   ALT 35 07/31/2020 0327   ALKPHOS 79 07/31/2020 0327   BILITOT 0.4 07/31/2020 0327   GFRNONAA >60 08/27/2022 1610   Lab Results  Component Value Date   CHOL 131 10/15/2020   HDL 54 10/15/2020   LDLCALC 69 10/15/2020   TRIG 42 10/15/2020   CHOLHDL 2.4 10/15/2020   Lab Results  Component Value Date    HGBA1C 4.8 10/15/2020   Lab Results  Component Value Date   VITAMINB12 558 08/27/2022   Lab Results  Component Value Date   TSH 2.054 10/15/2020    MI Brain/Cervical Spine 08/27/2022 ([facility]) 1. No acute intracranial process. No evidence of acute or subacute infarct. 2. C4-C5 mild left neural foraminal narrowing. No spinal canal stenosis. 3. No evidence of demyelinating disease in the brain or cervical spinal cord.   MRA Head and Neck 08/24/2022 ([facility]) Normal MRA of the head and neck.    ASSESSMENT AND PLAN  42 y.o. year old female with history of bipolar disorder, anxiety/depression, chronic headaches, previous history of suicide attempt who is presenting with complaint of left upper and lower extremity weakness.  In the past couple months patient has visited three hospitals, had multiple MRIs which of the brain and cervical spine,  MRA head and neck which were all negative.  On exam today there is poor effort, she does complain of weakness 4 out of 5 on exam and reported decrease sensation about 50% in the left when compared to the right.  There was also drift in the left arm but no pronation. Her reflexes were all normal and symmetric.  Based on normal brain imaging and abnormalities seen on examination, I have told patient that it is my medical opinion that she has a functional neurological disorder.  I have explained to the patient that the symptoms that she is having do not correlate with any brain or spinal cord abnormality, this is likely stress related.  Initially she was questioning the diagnosis but after spending additional time in discussing what a functional neurological disorder is she is more accepting.  I have strongly advised her to reestablish care with a psychiatrist and therapist and to seek a second opinion if she is not satisfied with the diagnosis.  Acceptance of the diagnosis will lead to faster recovery. She was previously on bupropion, Seroquel and Zoloft  but these medications were discontinued.  She is also reporting a diagnosis of lupus, review of her recent lab on June 14  show a normal ANA, normal C-reactive protein and normal sed rate.  She was very fixated regarding the lupus diagnosis and I did inform her either way, I do not suspect Lupus to cause her weakness when there are no lesion in the brain and spinal cord and  I will not be the specialist that take care of lupus.  She will have to see a rheumatologist. She is also mentioning abnormal liver function.  Again her CMP on June 5 showed normal liver enzymes and a CMP on June 10 show an elevated ALT and normal AST.  She denies any alcohol use, she had a [place] in 2022 showing fatty liver.  Record review medical record review reported she had admission in 2022 for suicide attempt in the setting of alcohol intoxication. Plan for now is for patient to re-establish care with her psychiatrist to further management of her functional neurological disorder. I will refer to one too. She should continue to follow up with PCP and seek a second opinion if she is not satisfied with her diagnosis.    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 Left arm weakness   3. Left leg weakness      Patient Instructions  Referral to psychiatry  Continue to follow up with PCP  Return as needed   Orders Placed This Encounter  Procedures   Ambulatory referral to Psychiatry    No orders of the defined types were placed in this encounter.   Return if symptoms worsen or fail to improve.  I have spent a total of 75 minutes dedicated to this patient today, preparing to see patient, performing a medically appropriate examination and evaluation, ordering tests and/or medications and procedures, and counseling and educating the patient/family/caregiver; independently interpreting result and communicating results to the family/patient/caregiver; and documenting clinical information in the  electronic medical record.   Windell Norfolk, MD 10/02/2022, 8:32 AM  [facility] [address] [place], [place] [address] [phone]

## 2022-10-02 NOTE — Patient Instructions (Signed)
Referral to psychiatry  Continue to follow up with PCP  Return as needed

## 2022-10-07 LAB — LAB REPORT - SCANNED: EGFR: 110.2

## 2022-10-12 NOTE — Progress Notes (Signed)
Office Visit Note  Patient: Monique Floyd             Date of Birth: October 26, 1980           MRN: [id number]             PCP: Lars Mage, NP Referring: Lars Mage, NP Visit Date: 10/15/2022 Occupation: @GUAROCC @  Subjective:  Positive ANA, left-sided weakness  History of Present Illness: Monique Floyd is a 42 y.o. female seen in consultation per request of her PCP.  According the patient her symptoms started in June 2024 while she was at work.  She states while she is at work she started experiencing chest tightness, chest pain and dizziness.  She states EMT came to check on her.  She had elevated heart rate and elevated blood pressure.  She also had a syncopal episode.  She started experiencing left-sided weakness and was unable to talk and walk.  She was taken to the [facility] emergency room where she had extensive workup.  She was told that she may develop shingles.  Patient states that she was discharged home but never developed any rash.  She followed up with her PCP due to left arm numbness and weakness.  She was also having neck pain and pain in her left hip.  She had extensive workup by her PCP including EKG and labs which were unremarkable.  She states her PCP heard a murmur and carotid bruit and sent her to the emergency room.  She was discharged from the emergency room to follow-up with a cardiologist.  She had cardiology workup which was negative.  She also had an evaluation by a neurologist in July which was negative.  She states at one time she was found to have elevated LFTs and had CT scan of the abdomen which was unremarkable.  She was also found to have positive ANA and double-stranded DNA on her labs for that reason she was referred to me.  According the patient she has been experiencing pain and discomfort in her shoulders hands and knees and hips.  She notices swelling in her hips and her knees.  She gives history of dry mouth, photosensitivity.  There is no  history of malar rash, Raynaud's phenomenon.  There is history of multiple sclerosis in her [place] questionable history of lupus in her maternal grandmother.  She is gravida 7, para 6, miscarriages 2.  She had preeclampsia with all pregnancies per patient.  She had no history of DVTs.  She is s/p tubal ligation.  She used to work as a Ship broker at [employer].  She enjoys reading.  She is not able to exercise currently.    Activities of Daily Living:  Patient reports morning stiffness for 6-7 hours.   Patient Reports nocturnal pain.  Difficulty dressing/grooming: Reports Difficulty climbing stairs: Reports Difficulty getting out of chair: Reports Difficulty using hands for taps, buttons, cutlery, and/or writing: Reports  Review of Systems  Constitutional:  Positive for fatigue.  HENT:  Positive for mouth dryness. Negative for mouth sores.   Eyes:  Positive for photophobia. Negative for dryness.  Respiratory:  Negative for shortness of breath.   Cardiovascular:  Negative for chest pain and palpitations.  Gastrointestinal:  Positive for constipation and nausea. Negative for blood in stool and diarrhea.  Endocrine: Positive for increased urination.  Genitourinary:  Positive for painful urination and involuntary urination.  Musculoskeletal:  Positive for joint pain, gait problem, joint pain, joint swelling, myalgias, muscle weakness,  morning stiffness, muscle tenderness and myalgias.  Skin:  Positive for rash and sensitivity to sunlight. Negative for color change and hair loss.  Allergic/Immunologic: Negative for susceptible to infections.  Neurological:  Positive for dizziness, numbness and headaches.  Hematological:  Negative for swollen glands.  Psychiatric/Behavioral:  Positive for depressed mood and sleep disturbance. The patient is nervous/anxious.     PMFS History:  Patient Active Problem List   Diagnosis Date Noted   Anxiety disorder, unspecified 10/15/2020   Unspecified  mood (affective) disorder (HCC) 10/15/2020   Bipolar II disorder (HCC) 10/15/2020   Acute alcohol intoxication (HCC) 10/14/2020   Abdominal pain, epigastric    Abdominal pain 07/21/2020    Past Medical History:  Diagnosis Date   Abnormal Pap smear    Anxiety    Asthma    Headache    History of PCOS    Prior pregnancy complicated by PIH, antepartum     Family History  Problem Relation Age of Onset   Colon cancer Mother    Liver cancer Mother    Colon cancer Father    Congestive Heart Failure Father    Diabetes Father    Healthy Sister    Healthy Brother    Multiple sclerosis Paternal Aunt    Dementia Paternal Grandmother    Lupus Paternal Grandmother    Healthy Son    Healthy Daughter    Ovarian cysts Daughter    Healthy Daughter    Healthy Daughter    Healthy Daughter    Healthy Daughter    Past Surgical History:  Procedure Laterality Date   CESAREAN SECTION     2010 and 2013   CHOLECYSTECTOMY  2019   EYE SURGERY Right    x2 as a  baby   HAND SURGERY Right 2006   LAPAROSCOPIC GASTRIC BANDING     LAPAROSCOPIC REPAIR AND REMOVAL OF GASTRIC BAND     Social History   Social History Narrative   Not on file    There is no immunization history on file for this patient.   Objective: Vital Signs: BP 115/78 (BP Location: Right Arm, Patient Position: Sitting, Cuff Size: Normal)   Pulse 76   Resp 15   Ht 5\' 2"  (1.575 m)   Wt 171 lb 3.2 oz (77.7 kg)   BMI 31.31 kg/m    Physical Exam Vitals and nursing note reviewed.  Constitutional:      Appearance: She is well-developed.  HENT:     Head: Normocephalic and atraumatic.  Eyes:     Conjunctiva/sclera: Conjunctivae normal.  Cardiovascular:     Rate and Rhythm: Normal rate and regular rhythm.     Heart sounds: Normal heart sounds.  Pulmonary:     Effort: Pulmonary effort is normal.     Breath sounds: Normal breath sounds.  Abdominal:     General: Bowel sounds are normal.     Palpations: Abdomen is soft.   Musculoskeletal:     Cervical back: Normal range of motion.  Lymphadenopathy:     Cervical: No cervical adenopathy.  Skin:    General: Skin is warm and dry.     Capillary Refill: Capillary refill takes less than 2 seconds.  Neurological:     Mental Status: She is alert and oriented to person, place, and time.     Comments: Patient had difficulty ambulating due to left lower extremity weakness.  She ambulates with the help of a cane.  DTRs were intact.  Psychiatric:  Behavior: Behavior normal.      Musculoskeletal Exam: Cervical spine was in good range of motion.  She had no tenderness over thoracic and lumbar spine.  Shoulder joints, elbow joints, wrist joints, MCPs PIPs and DIPs were in good range of motion with no synovitis.  Hip joints and knee joints were in good range of motion without any warmth swelling or effusion.  There was no tenderness over ankles or MTPs.  No synovitis was noted.  CDAI Exam: CDAI Score: -- Patient Global: --; Provider Global: -- Swollen: --; Tender: -- Joint Exam 10/15/2022   No joint exam has been documented for this visit   There is currently no information documented on the homunculus. Go to the Rheumatology activity and complete the homunculus joint exam.  Investigation: No additional findings.  Imaging: No results found.  Recent Labs: Lab Results  Component Value Date   WBC 7.1 08/27/2022   HGB 11.4 (L) 08/27/2022   PLT 266 08/27/2022   NA 134 (L) 08/27/2022   K 3.9 08/27/2022   CL 104 08/27/2022   CO2 22 08/27/2022   GLUCOSE 86 08/27/2022   BUN 9 08/27/2022   CREATININE 0.67 08/27/2022   BILITOT 0.4 07/31/2020   ALKPHOS 79 07/31/2020   AST 24 07/31/2020   ALT 35 07/31/2020   PROT 4.8 (L) 07/31/2020   ALBUMIN 2.8 (L) 07/31/2020   CALCIUM 8.3 (L) 08/27/2022   MRI cervical spine August 27, 2022  IMPRESSION: 1. No acute intracranial process. No evidence of acute or subacute infarct. 2. C4-C5 mild left neural foraminal  narrowing. No spinal canal stenosis. 3. No evidence of demyelinating disease in the brain or cervical spinal cord.  Electronically Signed   By: Wiliam Ke M.D.   On: 08/27/2022 19:57   Jul 25, 2020 CT abdomen and pelvis  IMPRESSION: No acute finding.  No bowel obstruction or visible inflammation.     Electronically Signed   By: Marnee Spring M.D.   On: 07/25/2020 04:57    Speciality Comments: No specialty comments available.  Procedures:  No procedures performed Allergies: Bee venom, Codeine, Iodinated contrast media, and Hydromorphone   Assessment / Plan:     Visit Diagnoses: Positive ANA (antinuclear antibody) - 08/30/22: ANA+, dsDNA 23, uric acid 3.1, CK 29, RF-, CRP WNL, TSH WNL, ESR 3, vit D 16.5, anti-CCP-, LA not detected, Ro-, La-, Sm-, RNP-, Scl-70-, CENP-, Jo1- -patient was referred to me for positive ANA and positive double-stranded DNA.  Her sed rate was normal.  History of preeclampsia and miscarriages x 2.  There is no history of DVTs.  There is no history of oral ulcers, nasal ulcers.  She gives history of dry mouth, rashes, joint pain, joint swelling, muscle pain.  No synovitis was noted on the examination.  No rashes were noted on the examination.  She had good capillary refill without any nailbed capillary changes.  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  Chronic pain syndrome-treated with narcotics in the past.  Left-sided weakness-patient states that her symptoms started in June 2024 with chest pain and left-sided weakness.  She is unable to walk without the help of cane since then.  She had frequent falls.  She had extensive workup in the emergency room which was negative.  She was  also  seen by Dr. Teresa Coombs, neurologist and the workup was negative.  Neuropathy-patient gives history of tingling and numbness in her extremities.  She has been on gabapentin.  Elevated LFTs she had elevated LFTs in May 2022.  Most recent LFTs were normal.  I will check labs again today.  History of asthma  B12 deficiency-patient to long-term history of B12 deficiency.  She has been been on B12.  Vitamin D deficiency-vitamin D was low at 16.5 on August 30, 2022.  Patient states that she took vitamin D 50,000 units for 3 months and then stopped it.  I will check vitamin D level today.  Sleep disturbance - On hydroxyzine and trazodone  History of anxiety  Family history of autoimmune disorder - maternal aunt -MS, MGM-lupus ?  Orders: Orders Placed This Encounter  Procedures   Protein / creatinine ratio, urine   CBC with Differential/Platelet   COMPLETE METABOLIC PANEL WITH GFR   Sedimentation rate   Anti-DNA antibody, double-stranded   C3 and C4   ANA   Beta-2 glycoprotein antibodies   Cardiolipin antibodies, IgG, IgM, IgA   VITAMIN D 25 Hydroxy (Vit-D Deficiency, Fractures)   No orders of the defined types were placed in this encounter.    Follow-Up Instructions: Return for +ANA.   Pollyann Savoy, MD  Note - This record has been created using Animal nutritionist.  Chart creation errors have been sought, but may not always  have been located. Such creation errors do not reflect on  the standard of medical care.

## 2022-10-15 ENCOUNTER — Encounter: Payer: Self-pay | Admitting: Rheumatology

## 2022-10-15 ENCOUNTER — Ambulatory Visit: Payer: 59 | Attending: Rheumatology | Admitting: Rheumatology

## 2022-10-15 VITALS — BP 115/78 | HR 76 | Resp 15 | Ht 62.0 in | Wt 171.2 lb

## 2022-10-15 DIAGNOSIS — R768 Other specified abnormal immunological findings in serum: Secondary | ICD-10-CM | POA: Diagnosis not present

## 2022-10-15 DIAGNOSIS — G894 Chronic pain syndrome: Secondary | ICD-10-CM | POA: Diagnosis not present

## 2022-10-15 DIAGNOSIS — M249 Joint derangement, unspecified: Secondary | ICD-10-CM

## 2022-10-15 DIAGNOSIS — G629 Polyneuropathy, unspecified: Secondary | ICD-10-CM

## 2022-10-15 DIAGNOSIS — R7989 Other specified abnormal findings of blood chemistry: Secondary | ICD-10-CM

## 2022-10-15 DIAGNOSIS — E559 Vitamin D deficiency, unspecified: Secondary | ICD-10-CM

## 2022-10-15 DIAGNOSIS — Z8709 Personal history of other diseases of the respiratory system: Secondary | ICD-10-CM

## 2022-10-15 DIAGNOSIS — E538 Deficiency of other specified B group vitamins: Secondary | ICD-10-CM

## 2022-10-15 DIAGNOSIS — Z832 Family history of diseases of the blood and blood-forming organs and certain disorders involving the immune mechanism: Secondary | ICD-10-CM

## 2022-10-15 DIAGNOSIS — G479 Sleep disorder, unspecified: Secondary | ICD-10-CM

## 2022-10-15 DIAGNOSIS — R531 Weakness: Secondary | ICD-10-CM

## 2022-10-15 DIAGNOSIS — Z8659 Personal history of other mental and behavioral disorders: Secondary | ICD-10-CM

## 2022-10-15 LAB — CBC WITH DIFFERENTIAL/PLATELET
Absolute Monocytes: 462 cells/uL (ref 200–950)
Basophils Absolute: 71 cells/uL (ref 0–200)
Basophils Relative: 1 %
Eosinophils Absolute: 57 cells/uL (ref 15–500)
Eosinophils Relative: 0.8 %
HCT: 35.2 % (ref 35.0–45.0)
Hemoglobin: 11.1 g/dL — ABNORMAL LOW (ref 11.7–15.5)
Lymphs Abs: 1718 cells/uL (ref 850–3900)
MCH: 27.6 pg (ref 27.0–33.0)
MCHC: 31.5 g/dL — ABNORMAL LOW (ref 32.0–36.0)
MCV: 87.6 fL (ref 80.0–100.0)
MPV: 10.4 fL (ref 7.5–12.5)
Monocytes Relative: 6.5 %
Neutro Abs: 4793 cells/uL (ref 1500–7800)
Neutrophils Relative %: 67.5 %
Platelets: 310 10*3/uL (ref 140–400)
RBC: 4.02 10*6/uL (ref 3.80–5.10)
RDW: 13.3 % (ref 11.0–15.0)
Total Lymphocyte: 24.2 %
WBC: 7.1 10*3/uL (ref 3.8–10.8)

## 2022-10-15 LAB — SEDIMENTATION RATE: Sed Rate: 9 mm/h (ref 0–20)

## 2022-10-26 NOTE — Progress Notes (Signed)
Office Visit Note  Patient: Monique Floyd             Date of Birth: 05/04/1980           MRN: [id number]             PCP: Lars Mage, NP Referring: Lars Mage, NP Visit Date: 11/09/2022 Occupation: @GUAROCC @  Subjective:  Positive ANA  History of Present Illness: Monique Floyd is a 42 y.o. female returns for follow-up visit today after her evaluation for positive ANA and left-sided weakness.  Patient states that she continues to have difficulty walking due to left-sided weakness.  She has been using a cane.  She gives history of dry mouth.  She denies any history of oral ulcers, nasal ulcers, malar rash, photosensitivity, Raynaud's, lymphadenopathy or inflammatory arthritis.  There is no history of joint swelling.    Activities of Daily Living:  Patient reports morning stiffness for several hours.   Patient Reports nocturnal pain.  Difficulty dressing/grooming: Reports Difficulty climbing stairs: Reports Difficulty getting out of chair: Reports Difficulty using hands for taps, buttons, cutlery, and/or writing: Reports  Review of Systems  Constitutional:  Positive for fatigue.  HENT:  Positive for mouth dryness. Negative for mouth sores.   Eyes:  Negative for dryness.  Respiratory:  Positive for shortness of breath. Negative for wheezing.   Cardiovascular:  Negative for chest pain and palpitations.  Gastrointestinal:  Negative for blood in stool, constipation and diarrhea.  Endocrine: Positive for increased urination.  Genitourinary:  Negative for involuntary urination.  Musculoskeletal:  Positive for joint pain, gait problem, joint pain, joint swelling, myalgias, muscle weakness, morning stiffness, muscle tenderness and myalgias.  Skin:  Positive for hair loss and sensitivity to sunlight. Negative for color change and rash.  Allergic/Immunologic: Negative for susceptible to infections.  Neurological:  Positive for dizziness and headaches.  Hematological:   Negative for swollen glands.  Psychiatric/Behavioral:  Positive for sleep disturbance. Negative for depressed mood. The patient is nervous/anxious.     PMFS History:  Patient Active Problem List   Diagnosis Date Noted   Anxiety disorder, unspecified 10/15/2020   Unspecified mood (affective) disorder (HCC) 10/15/2020   Bipolar II disorder (HCC) 10/15/2020   Acute alcohol intoxication (HCC) 10/14/2020   Abdominal pain, epigastric    Abdominal pain 07/21/2020    Past Medical History:  Diagnosis Date   Abnormal Pap smear    Anxiety    Asthma    Headache    History of PCOS    Prior pregnancy complicated by PIH, antepartum     Family History  Problem Relation Age of Onset   Colon cancer Mother    Liver cancer Mother    Colon cancer Father    Congestive Heart Failure Father    Diabetes Father    Healthy Sister    Healthy Brother    Multiple sclerosis Paternal Aunt    Dementia Paternal Grandmother    Lupus Paternal Grandmother    Healthy Son    Healthy Daughter    Ovarian cysts Daughter    Healthy Daughter    Healthy Daughter    Healthy Daughter    Healthy Daughter    Past Surgical History:  Procedure Laterality Date   CESAREAN SECTION     2010 and 2013   CHOLECYSTECTOMY  2019   EYE SURGERY Right    x2 as a  baby   HAND SURGERY Right 2006   LAPAROSCOPIC GASTRIC BANDING     LAPAROSCOPIC  REPAIR AND REMOVAL OF GASTRIC BAND     Social History   Social History Narrative   Not on file    There is no immunization history on file for this patient.   Objective: Vital Signs: BP 128/76 (BP Location: Left Arm, Patient Position: Sitting, Cuff Size: Normal)   Pulse 84   Resp 15   Ht 5\' 2"  (1.575 m)   Wt 177 lb 3.2 oz (80.4 kg)   BMI 32.41 kg/m    Physical Exam Vitals and nursing note reviewed.  Constitutional:      Appearance: She is well-developed.  HENT:     Head: Normocephalic and atraumatic.  Eyes:     Conjunctiva/sclera: Conjunctivae normal.   Cardiovascular:     Rate and Rhythm: Normal rate and regular rhythm.     Heart sounds: Normal heart sounds.  Pulmonary:     Effort: Pulmonary effort is normal.     Breath sounds: Normal breath sounds.  Abdominal:     General: Bowel sounds are normal.     Palpations: Abdomen is soft.  Musculoskeletal:     Cervical back: Normal range of motion.  Lymphadenopathy:     Cervical: No cervical adenopathy.  Skin:    General: Skin is warm and dry.     Capillary Refill: Capillary refill takes less than 2 seconds.  Neurological:     Mental Status: She is alert and oriented to person, place, and time.  Psychiatric:        Behavior: Behavior normal.      Musculoskeletal Exam: Cervical spine was in good range of motion.  She had no thoracic or lumbar spine tenderness.  Shoulders, elbows, wrist joints, MCPs PIPs and DIPs with good range of motion with no synovitis.  Hip joints and knee joints with good range of motion without any warmth swelling or effusion.  There was no tenderness over ankles or MTPs.  CDAI Exam: CDAI Score: -- Patient Global: --; Provider Global: -- Swollen: --; Tender: -- Joint Exam 11/09/2022   No joint exam has been documented for this visit   There is currently no information documented on the homunculus. Go to the Rheumatology activity and complete the homunculus joint exam.  Investigation: No additional findings.  Imaging: No results found.  Recent Labs: Lab Results  Component Value Date   WBC 7.1 10/15/2022   HGB 11.1 (L) 10/15/2022   PLT 310 10/15/2022   NA 139 10/15/2022   K 4.3 10/15/2022   CL 107 10/15/2022   CO2 25 10/15/2022   GLUCOSE 86 10/15/2022   BUN 8 10/15/2022   CREATININE 0.63 10/15/2022   BILITOT 0.4 10/15/2022   ALKPHOS 79 07/31/2020   AST 35 (H) 10/15/2022   ALT 36 (H) 10/15/2022   PROT 6.8 10/15/2022   ALBUMIN 2.8 (L) 07/31/2020   CALCIUM 9.0 10/15/2022   10/15/2022 urine protein creatinine ratio 0.092 ANA negative,  double-stranded DNA 5 (indeterminate), C3 124, C4 12, sed rate 9, beta-2 GP 1 negative, anticardiolipin negative, vitamin D 33  08/30/22: ANA+, dsDNA 23, uric acid 3.1, CK 29, RF-, CRP WNL, TSH WNL, ESR 3, vit D 16.5, anti-CCP-, LA not detected, Ro-, La-, Sm-, RNP-, Scl-70-, CENP-, Jo1   Speciality Comments: No specialty comments available.  Procedures:  No procedures performed Allergies: Bee venom, Codeine, Iodinated contrast media, and Hydromorphone   Assessment / Plan:     Visit Diagnoses: Positive ANA (antinuclear antibody) - 10/15/2022 urine protein creatinine ratio 0.092 ANA negative, double-stranded DNA 5 (indeterminate),  C3 124, C4 12, sed rate 9, beta-2 GP 1 negative, anticardiolipin negative.  I did detailed discussion with the patient and her husband regarding her lab work.  Repeat ANA is negative and double-stranded DNA is indeterminate.  She does not have any clinical features of autoimmune disease at this point.  I advised her to contact me if she develops any new symptoms.  I also advised her to get labs in a year.  Plan: Protein / creatinine ratio, urine, CBC with Differential/Platelet, COMPLETE METABOLIC PANEL WITH GFR, ANA, Anti-DNA antibody, double-stranded, C3 and C4, Sedimentation rate  Chronic pain syndrome-she continues to have generalized pain and discomfort.  Patient states she is not taking any NSAIDs or Tylenol.  Her LFTs are mildly elevated.  She was treated with narcotics in the past.  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  I gave her a handout on hand exercises.  She complains of discomfort in her CMC joints.  Vitamin D deficiency-she was treated for vitamin D deficiency.  Repeat vitamin D was 33.  Patient states she continues to take vitamin D supplement.  B12 deficiency  Elevated LFTs-LFTs were mildly elevated.  She will follow-up with her PCP regarding elevated LFTs.  Neuropathy-she continues to have left-sided  weakness and neuropathy.  Patient was evaluated by neurologist in the past.  I advised her to make a follow-up appointment with the neurologist regarding left-sided weakness.    History of asthma  History of anxiety  Sleep disturbance  Family history of autoimmune disorder - Possible lupus in MGM, Paternal aunt -MS  Orders: Orders Placed This Encounter  Procedures   Protein / creatinine ratio, urine   CBC with Differential/Platelet   COMPLETE METABOLIC PANEL WITH GFR   ANA   Anti-DNA antibody, double-stranded   C3 and C4   Sedimentation rate   No orders of the defined types were placed in this encounter.    Follow-Up Instructions: Return in about 1 year (around 11/09/2023) for +ANA.   Pollyann Savoy, MD  Note - This record has been created using Animal nutritionist.  Chart creation errors have been sought, but may not always  have been located. Such creation errors do not reflect on  the standard of medical care.

## 2022-11-09 ENCOUNTER — Ambulatory Visit: Payer: 59 | Attending: Rheumatology | Admitting: Rheumatology

## 2022-11-09 ENCOUNTER — Encounter: Payer: Self-pay | Admitting: Rheumatology

## 2022-11-09 VITALS — BP 128/76 | HR 84 | Resp 15 | Ht 62.0 in | Wt 177.2 lb

## 2022-11-09 DIAGNOSIS — E559 Vitamin D deficiency, unspecified: Secondary | ICD-10-CM

## 2022-11-09 DIAGNOSIS — R768 Other specified abnormal immunological findings in serum: Secondary | ICD-10-CM

## 2022-11-09 DIAGNOSIS — E538 Deficiency of other specified B group vitamins: Secondary | ICD-10-CM

## 2022-11-09 DIAGNOSIS — Z8659 Personal history of other mental and behavioral disorders: Secondary | ICD-10-CM

## 2022-11-09 DIAGNOSIS — Z832 Family history of diseases of the blood and blood-forming organs and certain disorders involving the immune mechanism: Secondary | ICD-10-CM

## 2022-11-09 DIAGNOSIS — G629 Polyneuropathy, unspecified: Secondary | ICD-10-CM

## 2022-11-09 DIAGNOSIS — R7989 Other specified abnormal findings of blood chemistry: Secondary | ICD-10-CM

## 2022-11-09 DIAGNOSIS — G894 Chronic pain syndrome: Secondary | ICD-10-CM | POA: Diagnosis not present

## 2022-11-09 DIAGNOSIS — M249 Joint derangement, unspecified: Secondary | ICD-10-CM | POA: Diagnosis not present

## 2022-11-09 DIAGNOSIS — G479 Sleep disorder, unspecified: Secondary | ICD-10-CM

## 2022-11-09 DIAGNOSIS — Z8709 Personal history of other diseases of the respiratory system: Secondary | ICD-10-CM

## 2022-11-09 NOTE — Patient Instructions (Signed)

## 2022-11-11 DIAGNOSIS — F32A Depression, unspecified: Secondary | ICD-10-CM | POA: Diagnosis not present

## 2022-11-11 DIAGNOSIS — F3160 Bipolar disorder, current episode mixed, unspecified: Secondary | ICD-10-CM | POA: Diagnosis not present

## 2022-11-11 DIAGNOSIS — R011 Cardiac murmur, unspecified: Secondary | ICD-10-CM | POA: Diagnosis not present

## 2022-11-11 DIAGNOSIS — G8929 Other chronic pain: Secondary | ICD-10-CM | POA: Diagnosis not present

## 2022-11-11 DIAGNOSIS — R270 Ataxia, unspecified: Secondary | ICD-10-CM | POA: Diagnosis not present

## 2022-11-11 DIAGNOSIS — R519 Headache, unspecified: Secondary | ICD-10-CM | POA: Diagnosis not present

## 2022-11-11 DIAGNOSIS — R2689 Other abnormalities of gait and mobility: Secondary | ICD-10-CM | POA: Diagnosis not present

## 2022-11-11 DIAGNOSIS — R531 Weakness: Secondary | ICD-10-CM | POA: Diagnosis not present

## 2022-11-11 DIAGNOSIS — F419 Anxiety disorder, unspecified: Secondary | ICD-10-CM | POA: Diagnosis not present

## 2022-11-12 LAB — LAB REPORT - SCANNED: EGFR: 110.2

## 2022-11-16 LAB — LAB REPORT - SCANNED: EGFR: 110.2

## 2022-11-24 DIAGNOSIS — R531 Weakness: Secondary | ICD-10-CM | POA: Diagnosis not present

## 2022-11-24 DIAGNOSIS — R251 Tremor, unspecified: Secondary | ICD-10-CM | POA: Diagnosis not present

## 2022-11-24 DIAGNOSIS — R29810 Facial weakness: Secondary | ICD-10-CM | POA: Diagnosis not present

## 2022-11-24 DIAGNOSIS — F419 Anxiety disorder, unspecified: Secondary | ICD-10-CM | POA: Diagnosis not present

## 2022-11-24 DIAGNOSIS — R270 Ataxia, unspecified: Secondary | ICD-10-CM | POA: Diagnosis not present

## 2022-11-24 DIAGNOSIS — R011 Cardiac murmur, unspecified: Secondary | ICD-10-CM | POA: Diagnosis not present

## 2022-11-24 DIAGNOSIS — R55 Syncope and collapse: Secondary | ICD-10-CM | POA: Diagnosis not present

## 2022-11-24 DIAGNOSIS — G8929 Other chronic pain: Secondary | ICD-10-CM | POA: Diagnosis not present

## 2022-11-24 DIAGNOSIS — F3160 Bipolar disorder, current episode mixed, unspecified: Secondary | ICD-10-CM | POA: Diagnosis not present

## 2022-11-30 ENCOUNTER — Other Ambulatory Visit: Payer: Self-pay | Admitting: Student

## 2022-11-30 DIAGNOSIS — Z1231 Encounter for screening mammogram for malignant neoplasm of breast: Secondary | ICD-10-CM

## 2022-12-03 ENCOUNTER — Ambulatory Visit
Admission: RE | Admit: 2022-12-03 | Discharge: 2022-12-03 | Disposition: A | Discharge status: Home / Self Care | Payer: 59 | Source: Ambulatory Visit

## 2022-12-03 DIAGNOSIS — Z1231 Encounter for screening mammogram for malignant neoplasm of breast: Secondary | ICD-10-CM

## 2022-12-07 ENCOUNTER — Other Ambulatory Visit: Payer: Self-pay | Admitting: Student

## 2022-12-07 DIAGNOSIS — R928 Other abnormal and inconclusive findings on diagnostic imaging of breast: Secondary | ICD-10-CM

## 2022-12-08 DIAGNOSIS — R2689 Other abnormalities of gait and mobility: Secondary | ICD-10-CM | POA: Diagnosis not present

## 2022-12-09 DIAGNOSIS — R55 Syncope and collapse: Secondary | ICD-10-CM | POA: Diagnosis not present

## 2022-12-09 DIAGNOSIS — R2689 Other abnormalities of gait and mobility: Secondary | ICD-10-CM | POA: Diagnosis not present

## 2022-12-09 DIAGNOSIS — G4700 Insomnia, unspecified: Secondary | ICD-10-CM | POA: Diagnosis not present

## 2022-12-09 DIAGNOSIS — F3160 Bipolar disorder, current episode mixed, unspecified: Secondary | ICD-10-CM | POA: Diagnosis not present

## 2022-12-09 DIAGNOSIS — R270 Ataxia, unspecified: Secondary | ICD-10-CM | POA: Diagnosis not present

## 2022-12-09 DIAGNOSIS — F419 Anxiety disorder, unspecified: Secondary | ICD-10-CM | POA: Diagnosis not present

## 2022-12-10 ENCOUNTER — Other Ambulatory Visit: Payer: Self-pay

## 2022-12-10 DIAGNOSIS — G4700 Insomnia, unspecified: Secondary | ICD-10-CM | POA: Insufficient documentation

## 2022-12-10 DIAGNOSIS — F419 Anxiety disorder, unspecified: Secondary | ICD-10-CM | POA: Insufficient documentation

## 2022-12-10 DIAGNOSIS — J45909 Unspecified asthma, uncomplicated: Secondary | ICD-10-CM | POA: Insufficient documentation

## 2022-12-10 DIAGNOSIS — Z8742 Personal history of other diseases of the female genital tract: Secondary | ICD-10-CM | POA: Insufficient documentation

## 2022-12-10 DIAGNOSIS — G8929 Other chronic pain: Secondary | ICD-10-CM | POA: Insufficient documentation

## 2022-12-10 DIAGNOSIS — R2689 Other abnormalities of gait and mobility: Secondary | ICD-10-CM | POA: Insufficient documentation

## 2022-12-10 DIAGNOSIS — O09899 Supervision of other high risk pregnancies, unspecified trimester: Secondary | ICD-10-CM | POA: Insufficient documentation

## 2022-12-10 DIAGNOSIS — R270 Ataxia, unspecified: Secondary | ICD-10-CM | POA: Insufficient documentation

## 2022-12-10 DIAGNOSIS — R55 Syncope and collapse: Secondary | ICD-10-CM | POA: Insufficient documentation

## 2022-12-10 DIAGNOSIS — F319 Bipolar disorder, unspecified: Secondary | ICD-10-CM | POA: Insufficient documentation

## 2022-12-10 DIAGNOSIS — R519 Headache, unspecified: Secondary | ICD-10-CM | POA: Insufficient documentation

## 2022-12-14 ENCOUNTER — Ambulatory Visit: Payer: 59

## 2022-12-15 ENCOUNTER — Other Ambulatory Visit: Payer: 59

## 2022-12-20 ENCOUNTER — Ambulatory Visit: Payer: 59

## 2022-12-20 VITALS — BP 112/66 | HR 65 | Ht 63.0 in | Wt 187.2 lb

## 2022-12-20 DIAGNOSIS — R55 Syncope and collapse: Secondary | ICD-10-CM

## 2022-12-20 NOTE — Assessment & Plan Note (Signed)
Recurrent episodes, increased frequency over the past month. Near syncopal episodes in June during one of her ER visits on June 14.  Workup thus far with echocardiogram unremarkable. Neurology workup, rheumatology workup were thus far showed no major abnormalities.  She reports event monitor for 7 days done recently without any significant abnormalities reportedly she did have episodes during these events. Currently has further pending MRI studies and a repeat 2-week of event monitor ordered by PCP.  There are no significant hemodynamic concerns at this time during her office visit. EKG is unremarkable.  Given her recurrent syncopal episodes in the past month, we will repeat an echocardiogram to see if there are any interval changes in comparison to her recent echocardiogram from June.  Advised her caution changing position, not driving, no bathtub baths, the bathroom door unlocked and notify family members before heading in.  Will review the results of the echocardiogram and the 2-week event monitor.  Scheduled for 1 month follow-up visit in the office.

## 2022-12-20 NOTE — Patient Instructions (Signed)
Medication Instructions:  Your physician recommends that you continue on your current medications as directed. Please refer to the Current Medication list given to you today.  *If you need a refill on your cardiac medications before your next appointment, please call your pharmacy*   Lab Work: None ordered If you have labs (blood work) drawn today and your tests are completely normal, you will receive your results only by: MyChart Message (if you have MyChart) OR A paper copy in the mail If you have any lab test that is abnormal or we need to change your treatment, we will call you to review the results.   Testing/Procedures: Your physician has requested that you have an echocardiogram. Echocardiography is a painless test that uses sound waves to create images of your heart. It provides your doctor with information about the size and shape of your heart and how well your heart's chambers and valves are working. This procedure takes approximately one hour. There are no restrictions for this procedure. Please do NOT wear cologne, perfume, aftershave, or lotions (deodorant is allowed). Please arrive 15 minutes prior to your appointment time.     Follow-Up: At [facility], you and your health needs are our priority.  As part of our continuing mission to provide you with exceptional heart care, we have created designated Provider Care Teams.  These Care Teams include your primary Cardiologist (physician) and Advanced Practice Providers (APPs -  Physician Assistants and Nurse Practitioners) who all work together to provide you with the care you need, when you need it.  We recommend signing up for the patient portal called "MyChart".  Sign up information is provided on this After Visit Summary.  MyChart is used to connect with patients for Virtual Visits (Telemedicine).  Patients are able to view lab/test results, encounter notes, upcoming appointments, etc.  Non-urgent messages can be sent to  your provider as well.   To learn more about what you can do with MyChart, go to [email or web address].    Your next appointment:   1 month(s)  The format for your next appointment:   In Person  Provider:   Vern Claude Madireddy, MD   Other Instructions Echocardiogram An echocardiogram is a test that uses sound waves (ultrasound) to produce images of the heart. Images from an echocardiogram can provide important information about: Heart size and shape. The size and thickness and movement of your heart's walls. Heart muscle function and strength. Heart valve function or if you have stenosis. Stenosis is when the heart valves are too narrow. If blood is flowing backward through the heart valves (regurgitation). A tumor or infectious growth around the heart valves. Areas of heart muscle that are not working well because of poor blood flow or injury from a heart attack. Aneurysm detection. An aneurysm is a weak or damaged part of an artery wall. The wall bulges out from the normal force of blood pumping through the body. Tell a health care provider about: Any allergies you have. All medicines you are taking, including vitamins, herbs, eye drops, creams, and over-the-counter medicines. Any blood disorders you have. Any surgeries you have had. Any medical conditions you have. Whether you are pregnant or may be pregnant. What are the risks? Generally, this is a safe test. However, problems may occur, including an allergic reaction to dye (contrast) that may be used during the test. What happens before the test? No specific preparation is needed. You may eat and drink normally. What happens during the test? You will  take off your clothes from the waist up and put on a hospital gown. Electrodes or electrocardiogram (ECG)patches may be placed on your chest. The electrodes or patches are then connected to a device that monitors your heart rate and rhythm. You will lie down on a table for  an ultrasound exam. A gel will be applied to your chest to help sound waves pass through your skin. A handheld device, called a transducer, will be pressed against your chest and moved over your heart. The transducer produces sound waves that travel to your heart and bounce back (or "echo" back) to the transducer. These sound waves will be captured in real-time and changed into images of your heart that can be viewed on a video monitor. The images will be recorded on a computer and reviewed by your health care provider. You may be asked to change positions or hold your breath for a short time. This makes it easier to get different views or better views of your heart. In some cases, you may receive contrast through an IV in one of your veins. This can improve the quality of the pictures from your heart. The procedure may vary among health care providers and hospitals.   What can I expect after the test? You may return to your normal, everyday life, including diet, activities, and medicines, unless your health care provider tells you not to do that. Follow these instructions at home: It is up to you to get the results of your test. Ask your health care provider, or the department that is doing the test, when your results will be ready. Keep all follow-up visits. This is important. Summary An echocardiogram is a test that uses sound waves (ultrasound) to produce images of the heart. Images from an echocardiogram can provide important information about the size and shape of your heart, heart muscle function, heart valve function, and other possible heart problems. You do not need to do anything to prepare before this test. You may eat and drink normally. After the echocardiogram is completed, you may return to your normal, everyday life, unless your health care provider tells you not to do that. This information is not intended to replace advice given to you by your health care provider. Make sure you  discuss any questions you have with your health care provider. Document Revised: 10/23/2019 Document Reviewed: 10/23/2019 Elsevier Patient Education  2021 [employer]   Important Information About Sugar

## 2022-12-20 NOTE — Progress Notes (Addendum)
Cardiology Consultation:    Date:  12/20/2022   ID:  Monique Floyd, DOB [birth year], MRN [id number]  PCP:  Monique Mage, NP  Cardiologist:  Luretha Murphy, MD  Neurologist: Windell Norfolk MD; last visit 09/30/2022   Referring MD: Monique Mage, NP   No chief complaint on file. 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     Problem List Items Addressed This Visit     Syncope and collapse - Primary    Recurrent episodes, increased frequency over the past month. Near syncopal episodes in June during one of her ER visits on June 14.  Workup thus far with echocardiogram unremarkable. Neurology workup, rheumatology workup were thus far showed no major abnormalities.  She reports event monitor for 7 days done recently without any significant abnormalities reportedly she did have episodes during these events. Currently has further pending MRI studies and a repeat 2-week of event monitor ordered by PCP.  There are no significant hemodynamic concerns at this time during her office visit. EKG is unremarkable.  Given her recurrent syncopal episodes in the past month, we will repeat an echocardiogram to see if there are any interval changes in comparison to her recent echocardiogram from June.  Advised her caution changing position, not driving, no bathtub baths, the bathroom door unlocked and notify family members before heading in.  Will review the results of the echocardiogram and the 2-week event  monitor.  Scheduled for 1 month follow-up visit in the office.        Relevant Orders   EKG 12-Lead (Completed)   ECHOCARDIOGRAM COMPLETE    Addendum: [01-18-2023]: Event monitor results 8 days study from 11-25-2022 noted average heart rate 76/min, sinus rhythm throughout [ranging from 50 bpm to 148 bpm 1 patient triggered event and 6 auto triggered events correlated with sinus rhythm ranging from 50 bpm to sinus tachycardia 148 bpm. No PACs or PVCs were identified during the study.  Echocardiogram results from 01-13-2023 noted normal biventricular function with LVEF 60 to 65%, normal diastolic function, mild mitral regurgitation.  Overall these are reassuring findings.   History of Present Illness:    Monique Floyd is a 42 y.o. female who is being seen today for the evaluation of syncope at the request of Tetter, Devin B, NP.  Reports history of chronic pain syndrome, Bipolar disorder, and hypermobility of the joints follows up with rheumatologist, neuropathy with associated left-sided weakness, asthma, anxiety, sleep disturbance, polycystic ovarian syndrome and reported history of pregnancy-induced hypertension.  Following up regarding a breast mass with her PCP.  With regards to abnormal LFTs she is pending follow-up with gastroenterologist and liver specialist  Referred for further evaluation of frequent episodes of blacking out reported by the patient.  Lives at home with her spouse and 6 children [5 older daughters and a 5 year old son].  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  And no significant health issues and physical limitations until June. She had a visit on August 23, 2022 to the ER at [facility] for atypical chest pain associated with dizziness and  left-sided numbness nausea and vomiting, troponins were negative x 2 [3, 3], BNP was normal at 84, unremarkable CT chest moderate subcutaneous soft tissue infiltration/edema suggestive  of inflammation, MRI of the neck unremarkable.  She was recommended to follow-up outpatient with cardiology.  She had a visit with Dr.Junagadhwalla cardiologist at [facility] on August 26, 2022 and given her atypical symptoms and moderate reproducible findings no further ischemic workup for CAD was recommended.  With regards to dizziness and left-sided numbness further neurology follow-up was recommended.  She had another visit to the emergency room at [facility] on August 27, 2022 reportedly for symptoms of left arm pain and tingling with weakness and a fast heart rate sensation, notably with heart rates in 190s per her smart watch and near syncopal episodes while at work, and notably was told to have passed out being picked up by the EMS.  High-sensitivity troponin x 2 was unremarkable, inflammatory markers with ESR and CRP were unremarkable.  Repeat MRI brain and C-spine was unremarkable.  Was recommended outpatient follow-up with neurology and cardiology.  Echocardiogram August 27, 2022 noted LVEF 55 to 60% with normal biventricular function and aortic sclerosis with trace MR without significant valve abnormalities  Follow-up with neurologist as outpatient at [facility] neurology with Dr. Teresa Coombs 09/30/2022 reviewed, her symptoms and workup was felt to be suggestive of functional neurological disorder.  She had visits with rheumatologist on August 2 and August 27 with workup not suggestive of any specific autoimmune disease.  She was noted to have chronic pain syndrome and hypermobility of the joints.  Recommended to continue evaluation with neurologist and follow-up with PCP.  Tells me over the past month she has been having episodes of passing out, unprovoked.  Describes an episode of standing at the kitchen table making coffee passed out for unknown duration without any warning signs.  Has had multiple such events, 4 in the last week, couple occurred while in the restroom once while in the shower and  once while seated to have a bowel movement.  She does not have much memory of and recollection about the events.  Reports 1 instance of falling down hurting her forehead resulting in bleeding. Tells me she has had 1 week duration of event monitor during which she had episodes, results were unremarkable aspirin history, I do not have these results for my review.  Denies any chest pain, shortness of breath, orthopnea, palpitations. Mentions she has been ambulating using a cane.  She mentions having further repeat MRI studies pending at this time and PCP having ordered another 2 weeks study of Holter monitor to be initiated after MRI. I will request my office to obtain these results once completed for my review.  EKG in clinic today shows sinus rhythm heart rate 65/min, PR interval 154 ms, QTc 411 ms.  T wave inversions in lead III aVF Prior EKG from November 11, 2022 was similar with less prominent T wave changes in lead III aVF   echocardiogram from August 27, 2022 reported LVEF 55 to 60%, normal biventricular function, aortic sclerosis and trace MR without other significant valve abnormalities.  CT chest 11/23/2021 at [facility] no significant cardiovascular abnormalities.  Blood work results from 11-11-2022 noted sodium 139, potassium 4.5, BUN 10, creatinine 0.67, EGFR 110 Normal transaminases and alkaline phosphatase Lyme titer was unremarkable Hemoglobin 11.4, hematocrit 35.9, WBC 7.9, platelets 299  She does not smoke.  Mentions used to consume alcohol socially before her symptoms began in June but since  then has not been drinking any alcohol.  Denies any recreational drug use.   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   Calcium Carb-Cholecalciferol (CALCIUM PLUS VITAMIN D) 500-200 MG-UNIT TABS Take 1 tablet by mouth 3 (three) times daily.   calcium carbonate (TUMS - DOSED IN MG ELEMENTAL CALCIUM) 500 MG chewable tablet Chew 2 tablets (400 mg of elemental calcium total) by mouth 3 (three) times daily. (May buy from over the counter): For heart burn   Cholecalciferol (VITAMIN D3 ULTRA POTENCY) 1.25 MG (50000 UT) TABS Take 1 tablet by mouth daily.   Cyanocobalamin (B-12 PO) Take 200 mLs by mouth daily.   ferrous sulfate 325 (65 FE) MG EC tablet Take 325 mg by mouth daily.   gabapentin (NEURONTIN) 600 MG tablet Take 600 mg by mouth 3 (three) times daily.   hydrOXYzine (ATARAX) 25 MG tablet Take 25 mg by mouth 2 (two) times daily as needed for anxiety.   meloxicam (MOBIC) 7.5 MG tablet Take 7.5 mg by mouth 2 (two) times daily.   Multiple Vitamins-Minerals (ONE-A-DAY WOMENS PO) Take 1 tablet by mouth daily.   sertraline (ZOLOFT) 50 MG tablet Take 50 mg by mouth daily.   traZODone (DESYREL) 50 MG tablet Take 50-100 mg by mouth at bedtime.   VRAYLAR 3 MG capsule Take 3 mg by mouth at bedtime.     Allergies:   Bee venom, Codeine, Iodinated contrast media, and Hydromorphone    Social History   Socioeconomic History   Marital status: Married    Spouse name: Not on file   Number of children: Not on file   Years of education: Not on file   Highest education level: Not on file  Occupational History   Not on file  Tobacco Use   Smoking status: Former    Current packs/day: 0.00    Types: Cigarettes    Quit date: 06/14/2011    Years since quitting: 11.5    Passive exposure: Never   Smokeless tobacco: Not on file  Vaping Use   Vaping status: Some Days  Substance and Sexual Activity   Alcohol use: Not Currently   Drug use: Not Currently   Sexual activity: Yes  Other Topics Concern   Not on file  Social History Narrative   Not on file  Social Determinants of Health   Financial Resource Strain: Not on file  Food Insecurity: Not on file  Transportation Needs: Not on file  Physical Activity: Not on file  Stress: Not on file  Social Connections: Not on file     Family History: The patient's family history includes Colon cancer in her father and mother; Congestive Heart Failure in her father; Dementia in her paternal grandmother; Diabetes in her father; Healthy in her brother, daughter, daughter, daughter, daughter, daughter, sister, and son; Liver cancer in her mother; Lupus in her paternal grandmother; Multiple sclerosis in her paternal aunt; Ovarian cysts in her daughter. There is no history of Breast cancer. ROS:   Please see the history of present illness.    All 14 point review of systems negative except as described per history of present illness.  EKGs/Labs/Other Studies Reviewed:    The following studies were reviewed today:   EKG:  EKG Interpretation Date/Time:  Monday December 20 2022 10:55:54 EDT Ventricular Rate:  65 PR Interval:  154 QRS Duration:  82 QT Interval:  396 QTC Calculation: 411 R Axis:   78  Text Interpretation: Normal sinus rhythm with sinus arrhythmia Low voltage QRS Borderline ECG When compared with ECG of  27-Aug-2022 16:00, Questionable change in QRS axis T wave inversion now evident in Inferior leads Confirmed by Huntley Dec reddy [phone]) on 12/20/2022 11:01:41 AM    Recent Labs: 10/15/2022: ALT 36; BUN 8; Creat 0.63; Hemoglobin 11.1; Platelets 310; Potassium 4.3; Sodium 139  Recent Lipid Panel    Component Value Date/Time   CHOL 131 10/15/2020 0614   TRIG 42 10/15/2020 0614   HDL 54 10/15/2020 0614   CHOLHDL 2.4 10/15/2020 0614   VLDL 8 10/15/2020 0614   LDLCALC 69 10/15/2020 0614    Physical Exam:    VS:  BP 112/66   Pulse 65   Ht 5\' 3"  (1.6 m)   Wt 187 lb 3.2 oz (84.9 kg)   SpO2 99%   BMI 33.16 kg/m     Wt Readings from Last 3 Encounters:  12/20/22 187 lb 3.2 oz (84.9 kg)  11/09/22 177 lb 3.2 oz (80.4 kg)  10/15/22 171 lb 3.2 oz (77.7 kg)     GENERAL:  Well nourished, well developed in no acute distress NECK: No JVD; No carotid bruits CARDIAC: RRR, S1 and S2 present, no murmurs, no rubs, no gallops CHEST:  Clear to auscultation without rales, wheezing or rhonchi  Extremities: No pitting pedal edema. Pulses bilaterally symmetric with radial 2+ and dorsalis pedis 2+ NEUROLOGIC:  Alert and oriented x 3  Medication Adjustments/Labs and Tests Ordered: Current medicines are reviewed at length with the patient today.  Concerns regarding medicines are outlined above.  Orders Placed This Encounter  Procedures   EKG 12-Lead   ECHOCARDIOGRAM COMPLETE   No orders of the defined types were placed in this encounter.   Signed, Fareed Fung reddy Kyston Gonce, MD, MPH, [facility]. 12/20/2022 12:23 PM    [facility]

## 2022-12-23 ENCOUNTER — Ambulatory Visit
Admission: RE | Admit: 2022-12-23 | Discharge: 2022-12-23 | Disposition: A | Discharge status: Home / Self Care | Payer: 59 | Source: Ambulatory Visit | Attending: Student | Admitting: Student

## 2022-12-23 DIAGNOSIS — R928 Other abnormal and inconclusive findings on diagnostic imaging of breast: Secondary | ICD-10-CM

## 2023-01-06 DIAGNOSIS — R2689 Other abnormalities of gait and mobility: Secondary | ICD-10-CM | POA: Diagnosis not present

## 2023-01-06 DIAGNOSIS — G4700 Insomnia, unspecified: Secondary | ICD-10-CM | POA: Diagnosis not present

## 2023-01-06 DIAGNOSIS — F419 Anxiety disorder, unspecified: Secondary | ICD-10-CM | POA: Diagnosis not present

## 2023-01-06 DIAGNOSIS — F3160 Bipolar disorder, current episode mixed, unspecified: Secondary | ICD-10-CM | POA: Diagnosis not present

## 2023-01-13 ENCOUNTER — Ambulatory Visit: Payer: 59

## 2023-01-13 ENCOUNTER — Other Ambulatory Visit: Payer: Self-pay

## 2023-01-13 DIAGNOSIS — R55 Syncope and collapse: Secondary | ICD-10-CM

## 2023-01-13 DIAGNOSIS — R0989 Other specified symptoms and signs involving the circulatory and respiratory systems: Secondary | ICD-10-CM

## 2023-01-13 LAB — ECHOCARDIOGRAM COMPLETE: S' Lateral: 3.4 cm

## 2023-01-18 ENCOUNTER — Other Ambulatory Visit: Payer: Self-pay

## 2023-01-19 DIAGNOSIS — R531 Weakness: Secondary | ICD-10-CM | POA: Diagnosis not present

## 2023-01-19 DIAGNOSIS — R251 Tremor, unspecified: Secondary | ICD-10-CM | POA: Diagnosis not present

## 2023-01-20 ENCOUNTER — Ambulatory Visit: Payer: 59

## 2023-01-20 VITALS — BP 136/84 | HR 94 | Ht 63.0 in | Wt 185.8 lb

## 2023-01-20 DIAGNOSIS — R55 Syncope and collapse: Secondary | ICD-10-CM | POA: Diagnosis not present

## 2023-01-20 NOTE — Progress Notes (Signed)
Cardiology Consultation:    Date:  01/20/2023   ID:  Monique Floyd, DOB 28-Nov-1980, MRN [id number]  PCP:  Lars Mage, NP  Cardiologist:  Luretha Murphy, MD   Referring MD: Lars Mage, NP   No chief complaint on file.    ASSESSMENT AND PLAN:   Monique Floyd 42 year old woman with history of bipolar disorder, chronic pain syndrome, hypermobility, asthma, anxiety, sleep disturbance, polycystic ovarian syndrome, preeclampsia, atypical chest discomfort and recurrent syncopal episodes since June 2024.  Unremarkable event monitor results from September 2024 despite reported symptoms.  Echocardiogram results from 01-03-2023 unremarkable other than mild mitral regurgitation.  Continues to have recurrent episodes Problem List Items Addressed This Visit     Syncope and collapse - Primary    Unclear etiology. No obvious abnormality on cardiac workup thus far. She has ultrasound carotids pending. Advised to continue to take extreme caution changing position and avoid being unmonitored. Advised to avoid driving vehicles or operating any heavy machinery.  Will repeat another extended monitor for 14 days given her ongoing symptoms. Recommended to continue follow-up with neurologist.  Return to clinic for follow-up tentatively in 1 to 2 months.      Relevant Orders   LONG TERM MONITOR (3-14 DAYS)      History of Present Illness:    Monique Floyd is a 42 y.o. female who is being seen today for a follow-up visit regarding frequent syncopal episodes. Last visit with [place] in the office was 12-20-2022  History of bipolar disorder, chronic pain syndrome, hypermobility, asthma, anxiety, sleep disturbance, polycystic ovarian syndrome, preeclampsia, atypical chest pain and syncopal episodes since June 2024.  Here for the visit today by herself. Mentions she continues to have recurrent episodes of syncope, most recently was this week where she was standing by the washer and  suddenly passed out without any warning signs.  Her daughter found her, apparently was down for 7 to 8 minutes.  No injuries.  She feels she might have lost her bladder control.  He denies any tongue bites.  Event monitor results 8 days study from 11-25-2022 noted average heart rate 76/min, sinus rhythm throughout [ranging from 50 bpm to 148 bpm 1 patient triggered event and 6 auto triggered events correlated with sinus rhythm ranging from 50 bpm to sinus tachycardia 148 bpm. Sinus tachycardia of 148bpm was auto triggered, no symptoms associated with it. No PACs or PVCs were identified during the study. She does report symptoms during the event monitor period although unclear if she did infact pass out during those days.   Echocardiogram results from 01-13-2023 noted normal biventricular function with LVEF 60 to 65%, normal diastolic function, mild mitral regurgitation.  Past Medical History:  Diagnosis Date   Abdominal pain 07/21/2020   Abdominal pain, epigastric    Acute alcohol intoxication (HCC) 10/14/2020   Anxiety    Anxiety disorder, unspecified 10/15/2020   Asthma    Ataxia, unspecified    Bipolar disorder (HCC)    Bipolar II disorder (HCC) 10/15/2020   BMI 40.0-44.9, adult (HCC) 01/24/2018   Headache    History of PCOS    Insomnia    Morbid obesity (HCC) 07/07/2017   Other abnormalities of gait and mobility    Other chronic pain    Status post biliopancreatic diversion with duodenal switch 01/24/2018   Syncope and collapse    Unspecified mood (affective) disorder (HCC) 10/15/2020    Past Surgical History:  Procedure Laterality Date   CESAREAN SECTION  2010 and 2013   CHOLECYSTECTOMY  2019   EYE SURGERY Right    x2 as a  baby   HAND SURGERY Right 2006   LAPAROSCOPIC GASTRIC BANDING     LAPAROSCOPIC REPAIR AND REMOVAL OF GASTRIC BAND      Current Medications: Current Meds  Medication Sig   albuterol (VENTOLIN HFA) 108 (90 Base) MCG/ACT inhaler Inhale into the  lungs as needed.   Calcium Carb-Cholecalciferol (CALCIUM PLUS VITAMIN D) 500-200 MG-UNIT TABS Take 1 tablet by mouth 3 (three) times daily.   calcium carbonate (TUMS - DOSED IN MG ELEMENTAL CALCIUM) 500 MG chewable tablet Chew 2 tablets (400 mg of elemental calcium total) by mouth 3 (three) times daily. (May buy from over the counter): For heart burn   Cholecalciferol (VITAMIN D3) 1.25 MG (50000 UT) CAPS Take 1 capsule by mouth once a week.   Cyanocobalamin (B-12 PO) Take 200 mLs by mouth daily.   ferrous sulfate 325 (65 FE) MG EC tablet Take 325 mg by mouth daily.   gabapentin (NEURONTIN) 600 MG tablet Take 600 mg by mouth 3 (three) times daily.   hydrOXYzine (ATARAX) 50 MG tablet Take 50 mg by mouth 2 (two) times daily.   meloxicam (MOBIC) 7.5 MG tablet Take 7.5 mg by mouth 2 (two) times daily.   Multiple Vitamins-Minerals (ONE-A-DAY WOMENS PO) Take 1 tablet by mouth daily.   QUEtiapine (SEROQUEL XR) 50 MG TB24 24 hr tablet Take 50 mg by mouth at bedtime.   sertraline (ZOLOFT) 50 MG tablet Take 50 mg by mouth daily.   traZODone (DESYREL) 50 MG tablet Take 50-100 mg by mouth at bedtime.   VRAYLAR 3 MG capsule Take 3 mg by mouth at bedtime.     Allergies:   Bee venom, Codeine, Iodinated contrast media, and Hydromorphone   Social History   Socioeconomic History   Marital status: Married    Spouse name: Not on file   Number of children: Not on file   Years of education: Not on file   Highest education level: Not on file  Occupational History   Not on file  Tobacco Use   Smoking status: Former    Current packs/day: 0.00    Types: Cigarettes    Quit date: 06/14/2011    Years since quitting: 11.6    Passive exposure: Never   Smokeless tobacco: Not on file  Vaping Use   Vaping status: Some Days  Substance and Sexual Activity   Alcohol use: Not Currently   Drug use: Not Currently   Sexual activity: Yes  Other Topics Concern   Not on file  Social History Narrative   Not on file    Social Determinants of Health   Financial Resource Strain: Not on file  Food Insecurity: Not on file  Transportation Needs: Not on file  Physical Activity: Not on file  Stress: Not on file  Social Connections: Not on file     Family History: The patient's family history includes Colon cancer in her father and mother; Congestive Heart Failure in her father; Dementia in her paternal grandmother; Diabetes in her father; Healthy in her brother, daughter, daughter, daughter, daughter, daughter, sister, and son; Liver cancer in her mother; Lupus in her paternal grandmother; Multiple sclerosis in her paternal aunt; Ovarian cysts in her daughter. There is no history of Breast cancer. ROS:   Please see the history of present illness.    All 14 point review of systems negative except as described per history of  present illness.  EKGs/Labs/Other Studies Reviewed:    The following studies were reviewed today:   EKG:       Recent Labs: 10/15/2022: ALT 36; BUN 8; Creat 0.63; Hemoglobin 11.1; Platelets 310; Potassium 4.3; Sodium 139  Recent Lipid Panel    Component Value Date/Time   CHOL 131 10/15/2020 0614   TRIG 42 10/15/2020 0614   HDL 54 10/15/2020 0614   CHOLHDL 2.4 10/15/2020 0614   VLDL 8 10/15/2020 0614   LDLCALC 69 10/15/2020 0614    Physical Exam:    VS:  BP 136/84   Pulse 94   Ht 5\' 3"  (1.6 m)   Wt 185 lb 12.8 oz (84.3 kg)   SpO2 98%   BMI 32.91 kg/m     Wt Readings from Last 3 Encounters:  01/20/23 185 lb 12.8 oz (84.3 kg)  12/20/22 187 lb 3.2 oz (84.9 kg)  11/09/22 177 lb 3.2 oz (80.4 kg)     GENERAL:  Well nourished, well developed in no acute distress NECK: No JVD; No carotid bruits CARDIAC: RRR, S1 and S2 present, no murmurs, no rubs, no gallops CHEST:  Clear to auscultation without rales, wheezing or rhonchi  Extremities: No pitting pedal edema. Pulses bilaterally symmetric with radial 2+ and dorsalis pedis 2+ NEUROLOGIC:  Alert and oriented x  3  Medication Adjustments/Labs and Tests Ordered: Current medicines are reviewed at length with the patient today.  Concerns regarding medicines are outlined above.  Orders Placed This Encounter  Procedures   LONG TERM MONITOR (3-14 DAYS)   No orders of the defined types were placed in this encounter.   Signed, Rossetta Kama reddy Reyansh Kushnir, MD, MPH, [facility]. 01/20/2023 1:05 PM    [facility]

## 2023-01-20 NOTE — Assessment & Plan Note (Signed)
Unclear etiology. No obvious abnormality on cardiac workup thus far. She has ultrasound carotids pending. Advised to continue to take extreme caution changing position and avoid being unmonitored. Advised to avoid driving vehicles or operating any heavy machinery.  Will repeat another extended monitor for 14 days given her ongoing symptoms. Recommended to continue follow-up with neurologist.  Return to clinic for follow-up tentatively in 1 to 2 months.

## 2023-01-20 NOTE — Patient Instructions (Signed)
Medication Instructions:  Your physician recommends that you continue on your current medications as directed. Please refer to the Current Medication list given to you today.  *If you need a refill on your cardiac medications before your next appointment, please call your pharmacy*   Lab Work: None If you have labs (blood work) drawn today and your tests are completely normal, you will receive your results only by: MyChart Message (if you have MyChart) OR A paper copy in the mail If you have any lab test that is abnormal or we need to change your treatment, we will call you to review the results.   Testing/Procedures: A zio monitor was ordered today. It will remain on for 14 days. You will then return monitor and event diary in provided box. It takes 1-2 weeks for report to be downloaded and returned to [place]. We will call you with the results. If monitor falls off or has orange flashing light, please call Zio for further instructions.     Follow-Up: At [facility], you and your health needs are our priority.  As part of our continuing mission to provide you with exceptional heart care, we have created designated Provider Care Teams.  These Care Teams include your primary Cardiologist (physician) and Advanced Practice Providers (APPs -  Physician Assistants and Nurse Practitioners) who all work together to provide you with the care you need, when you need it.  We recommend signing up for the patient portal called "MyChart".  Sign up information is provided on this After Visit Summary.  MyChart is used to connect with patients for Virtual Visits (Telemedicine).  Patients are able to view lab/test results, encounter notes, upcoming appointments, etc.  Non-urgent messages can be sent to your provider as well.   To learn more about what you can do with MyChart, go to [email or web address].    Your next appointment:   Follow up to be determined after testing  Provider:   Huntley Dec, MD    Other Instructions None

## 2023-02-03 DIAGNOSIS — F419 Anxiety disorder, unspecified: Secondary | ICD-10-CM | POA: Diagnosis not present

## 2023-02-03 DIAGNOSIS — G8929 Other chronic pain: Secondary | ICD-10-CM | POA: Diagnosis not present

## 2023-02-03 DIAGNOSIS — R270 Ataxia, unspecified: Secondary | ICD-10-CM | POA: Diagnosis not present

## 2023-02-03 DIAGNOSIS — F3160 Bipolar disorder, current episode mixed, unspecified: Secondary | ICD-10-CM | POA: Diagnosis not present

## 2023-02-08 DIAGNOSIS — G959 Disease of spinal cord, unspecified: Secondary | ICD-10-CM | POA: Diagnosis not present

## 2023-02-08 DIAGNOSIS — Z6834 Body mass index (BMI) 34.0-34.9, adult: Secondary | ICD-10-CM | POA: Diagnosis not present

## 2023-02-09 ENCOUNTER — Ambulatory Visit: Payer: 59

## 2023-02-09 DIAGNOSIS — F419 Anxiety disorder, unspecified: Secondary | ICD-10-CM | POA: Diagnosis not present

## 2023-02-09 DIAGNOSIS — R0989 Other specified symptoms and signs involving the circulatory and respiratory systems: Secondary | ICD-10-CM

## 2023-02-09 DIAGNOSIS — G4700 Insomnia, unspecified: Secondary | ICD-10-CM | POA: Diagnosis not present

## 2023-02-09 DIAGNOSIS — F3160 Bipolar disorder, current episode mixed, unspecified: Secondary | ICD-10-CM | POA: Diagnosis not present

## 2023-02-09 DIAGNOSIS — G8929 Other chronic pain: Secondary | ICD-10-CM | POA: Diagnosis not present

## 2023-02-09 DIAGNOSIS — F32A Depression, unspecified: Secondary | ICD-10-CM | POA: Diagnosis not present

## 2023-02-16 DIAGNOSIS — G8929 Other chronic pain: Secondary | ICD-10-CM | POA: Diagnosis not present

## 2023-02-16 DIAGNOSIS — M5441 Lumbago with sciatica, right side: Secondary | ICD-10-CM | POA: Diagnosis not present

## 2023-02-16 DIAGNOSIS — M5416 Radiculopathy, lumbar region: Secondary | ICD-10-CM | POA: Diagnosis not present

## 2023-02-16 DIAGNOSIS — M5442 Lumbago with sciatica, left side: Secondary | ICD-10-CM | POA: Diagnosis not present

## 2023-02-18 DIAGNOSIS — K760 Fatty (change of) liver, not elsewhere classified: Secondary | ICD-10-CM | POA: Diagnosis not present

## 2023-02-18 DIAGNOSIS — R1010 Upper abdominal pain, unspecified: Secondary | ICD-10-CM | POA: Diagnosis not present

## 2023-02-18 DIAGNOSIS — R748 Abnormal levels of other serum enzymes: Secondary | ICD-10-CM | POA: Diagnosis not present

## 2023-02-18 DIAGNOSIS — E66811 Obesity, class 1: Secondary | ICD-10-CM | POA: Diagnosis not present

## 2023-02-21 ENCOUNTER — Encounter: Payer: 59 | Admitting: Rheumatology

## 2023-02-23 DIAGNOSIS — F419 Anxiety disorder, unspecified: Secondary | ICD-10-CM | POA: Diagnosis not present

## 2023-02-23 DIAGNOSIS — R55 Syncope and collapse: Secondary | ICD-10-CM | POA: Diagnosis not present

## 2023-02-23 DIAGNOSIS — M5416 Radiculopathy, lumbar region: Secondary | ICD-10-CM | POA: Diagnosis not present

## 2023-02-23 DIAGNOSIS — M79662 Pain in left lower leg: Secondary | ICD-10-CM | POA: Diagnosis not present

## 2023-02-23 DIAGNOSIS — G8929 Other chronic pain: Secondary | ICD-10-CM | POA: Diagnosis not present

## 2023-02-23 DIAGNOSIS — G4700 Insomnia, unspecified: Secondary | ICD-10-CM | POA: Diagnosis not present

## 2023-02-23 DIAGNOSIS — R2689 Other abnormalities of gait and mobility: Secondary | ICD-10-CM | POA: Diagnosis not present

## 2023-02-23 DIAGNOSIS — F3160 Bipolar disorder, current episode mixed, unspecified: Secondary | ICD-10-CM | POA: Diagnosis not present

## 2023-03-10 DIAGNOSIS — G959 Disease of spinal cord, unspecified: Secondary | ICD-10-CM | POA: Diagnosis not present

## 2023-03-14 ENCOUNTER — Telehealth: Payer: Self-pay

## 2023-03-14 NOTE — Telephone Encounter (Signed)
Pt viewed results on My Chart per Dr. Johnnette Barrios note. Routed to PCP.

## 2023-03-24 ENCOUNTER — Ambulatory Visit: Payer: 59 | Admitting: Rheumatology

## 2023-03-24 DIAGNOSIS — M5442 Lumbago with sciatica, left side: Secondary | ICD-10-CM | POA: Diagnosis not present

## 2023-03-24 DIAGNOSIS — M5417 Radiculopathy, lumbosacral region: Secondary | ICD-10-CM | POA: Diagnosis not present

## 2023-03-24 DIAGNOSIS — M5441 Lumbago with sciatica, right side: Secondary | ICD-10-CM | POA: Diagnosis not present

## 2023-03-24 DIAGNOSIS — G8929 Other chronic pain: Secondary | ICD-10-CM | POA: Diagnosis not present

## 2023-03-25 DIAGNOSIS — D529 Folate deficiency anemia, unspecified: Secondary | ICD-10-CM | POA: Diagnosis not present

## 2023-03-25 DIAGNOSIS — M5416 Radiculopathy, lumbar region: Secondary | ICD-10-CM | POA: Diagnosis not present

## 2023-03-25 DIAGNOSIS — F419 Anxiety disorder, unspecified: Secondary | ICD-10-CM | POA: Diagnosis not present

## 2023-03-25 DIAGNOSIS — D509 Iron deficiency anemia, unspecified: Secondary | ICD-10-CM | POA: Diagnosis not present

## 2023-03-25 DIAGNOSIS — R531 Weakness: Secondary | ICD-10-CM | POA: Diagnosis not present

## 2023-03-25 DIAGNOSIS — F3160 Bipolar disorder, current episode mixed, unspecified: Secondary | ICD-10-CM | POA: Diagnosis not present

## 2023-03-29 DIAGNOSIS — M4726 Other spondylosis with radiculopathy, lumbar region: Secondary | ICD-10-CM | POA: Diagnosis not present

## 2023-03-29 DIAGNOSIS — M4316 Spondylolisthesis, lumbar region: Secondary | ICD-10-CM | POA: Diagnosis not present

## 2023-03-29 DIAGNOSIS — M5116 Intervertebral disc disorders with radiculopathy, lumbar region: Secondary | ICD-10-CM | POA: Diagnosis not present

## 2023-03-29 DIAGNOSIS — M48061 Spinal stenosis, lumbar region without neurogenic claudication: Secondary | ICD-10-CM | POA: Diagnosis not present

## 2023-03-31 DIAGNOSIS — M4726 Other spondylosis with radiculopathy, lumbar region: Secondary | ICD-10-CM | POA: Diagnosis not present

## 2023-03-31 DIAGNOSIS — M4316 Spondylolisthesis, lumbar region: Secondary | ICD-10-CM | POA: Diagnosis not present

## 2023-03-31 DIAGNOSIS — M5116 Intervertebral disc disorders with radiculopathy, lumbar region: Secondary | ICD-10-CM | POA: Diagnosis not present

## 2023-04-19 DIAGNOSIS — G959 Disease of spinal cord, unspecified: Secondary | ICD-10-CM | POA: Diagnosis not present

## 2023-04-19 DIAGNOSIS — Z6836 Body mass index (BMI) 36.0-36.9, adult: Secondary | ICD-10-CM | POA: Diagnosis not present

## 2023-04-22 DIAGNOSIS — M5416 Radiculopathy, lumbar region: Secondary | ICD-10-CM | POA: Diagnosis not present

## 2023-04-25 DIAGNOSIS — D509 Iron deficiency anemia, unspecified: Secondary | ICD-10-CM | POA: Diagnosis not present

## 2023-04-28 DIAGNOSIS — M5416 Radiculopathy, lumbar region: Secondary | ICD-10-CM | POA: Diagnosis not present

## 2023-05-19 DIAGNOSIS — F419 Anxiety disorder, unspecified: Secondary | ICD-10-CM | POA: Diagnosis not present

## 2023-05-19 DIAGNOSIS — R21 Rash and other nonspecific skin eruption: Secondary | ICD-10-CM | POA: Diagnosis not present

## 2023-05-19 DIAGNOSIS — R2689 Other abnormalities of gait and mobility: Secondary | ICD-10-CM | POA: Diagnosis not present

## 2023-05-19 DIAGNOSIS — R55 Syncope and collapse: Secondary | ICD-10-CM | POA: Diagnosis not present

## 2023-05-19 DIAGNOSIS — F3160 Bipolar disorder, current episode mixed, unspecified: Secondary | ICD-10-CM | POA: Diagnosis not present

## 2023-05-19 DIAGNOSIS — M5416 Radiculopathy, lumbar region: Secondary | ICD-10-CM | POA: Diagnosis not present

## 2023-05-19 DIAGNOSIS — G8929 Other chronic pain: Secondary | ICD-10-CM | POA: Diagnosis not present

## 2023-06-07 DIAGNOSIS — D509 Iron deficiency anemia, unspecified: Secondary | ICD-10-CM | POA: Diagnosis not present

## 2023-06-22 DIAGNOSIS — M5416 Radiculopathy, lumbar region: Secondary | ICD-10-CM | POA: Diagnosis not present

## 2023-07-12 DIAGNOSIS — R531 Weakness: Secondary | ICD-10-CM | POA: Diagnosis not present

## 2023-07-12 DIAGNOSIS — R569 Unspecified convulsions: Secondary | ICD-10-CM | POA: Diagnosis not present

## 2023-07-12 DIAGNOSIS — R200 Anesthesia of skin: Secondary | ICD-10-CM | POA: Diagnosis not present

## 2023-07-14 DIAGNOSIS — G4700 Insomnia, unspecified: Secondary | ICD-10-CM | POA: Diagnosis not present

## 2023-07-14 DIAGNOSIS — M5416 Radiculopathy, lumbar region: Secondary | ICD-10-CM | POA: Diagnosis not present

## 2023-07-14 DIAGNOSIS — R2689 Other abnormalities of gait and mobility: Secondary | ICD-10-CM | POA: Diagnosis not present

## 2023-07-14 DIAGNOSIS — F419 Anxiety disorder, unspecified: Secondary | ICD-10-CM | POA: Diagnosis not present

## 2023-07-14 DIAGNOSIS — G8929 Other chronic pain: Secondary | ICD-10-CM | POA: Diagnosis not present

## 2023-07-14 DIAGNOSIS — F32A Depression, unspecified: Secondary | ICD-10-CM | POA: Diagnosis not present

## 2023-07-14 DIAGNOSIS — F3160 Bipolar disorder, current episode mixed, unspecified: Secondary | ICD-10-CM | POA: Diagnosis not present

## 2023-07-14 DIAGNOSIS — R21 Rash and other nonspecific skin eruption: Secondary | ICD-10-CM | POA: Diagnosis not present

## 2023-07-14 DIAGNOSIS — Z6837 Body mass index (BMI) 37.0-37.9, adult: Secondary | ICD-10-CM | POA: Diagnosis not present

## 2023-07-15 ENCOUNTER — Other Ambulatory Visit: Payer: Self-pay | Admitting: Neurology

## 2023-07-15 DIAGNOSIS — R200 Anesthesia of skin: Secondary | ICD-10-CM

## 2023-07-15 DIAGNOSIS — R531 Weakness: Secondary | ICD-10-CM

## 2023-07-30 ENCOUNTER — Ambulatory Visit: Admission: RE | Admit: 2023-07-30 | Source: Ambulatory Visit

## 2023-08-06 ENCOUNTER — Ambulatory Visit
Admission: RE | Admit: 2023-08-06 | Discharge: 2023-08-06 | Disposition: A | Discharge status: Home / Self Care | Source: Ambulatory Visit | Attending: Neurology | Admitting: Neurology

## 2023-08-06 DIAGNOSIS — R200 Anesthesia of skin: Secondary | ICD-10-CM | POA: Diagnosis present

## 2023-08-06 DIAGNOSIS — R531 Weakness: Secondary | ICD-10-CM | POA: Diagnosis present

## 2023-10-26 NOTE — Progress Notes (Deleted)
 Office Visit Note  Patient: Monique Floyd             Date of Birth: 1981/01/06           MRN: [id number]             PCP: Nestor Elston NOVAK, NP Referring: Nestor Elston NOVAK, NP Visit Date: 11/09/2023 Occupation: @GUAROCC @  Subjective:  No chief complaint on file.   History of Present Illness: Monique Floyd is a 43 y.o. female ***     Activities of Daily Living:  Patient reports morning stiffness for *** {minute/hour:19697}.   Patient {ACTIONS;DENIES/REPORTS:21021675::Denies} nocturnal pain.  Difficulty dressing/grooming: {ACTIONS;DENIES/REPORTS:21021675::Denies} Difficulty climbing stairs: {ACTIONS;DENIES/REPORTS:21021675::Denies} Difficulty getting out of chair: {ACTIONS;DENIES/REPORTS:21021675::Denies} Difficulty using hands for taps, buttons, cutlery, and/or writing: {ACTIONS;DENIES/REPORTS:21021675::Denies}  No Rheumatology ROS completed.   PMFS History:  Patient Active Problem List   Diagnosis Date Noted   Anxiety    Asthma    Ataxia, unspecified    Bipolar disorder (HCC)    Headache    History of PCOS    Insomnia    Other abnormalities of gait and mobility    Other chronic pain    Syncope and collapse    Anxiety disorder, unspecified 10/15/2020   Unspecified mood (affective) disorder (HCC) 10/15/2020   Bipolar II disorder (HCC) 10/15/2020   Acute alcohol intoxication (HCC) 10/14/2020   Abdominal pain, epigastric    Abdominal pain 07/21/2020   BMI 40.0-44.9, adult (HCC) 01/24/2018   Status post biliopancreatic diversion with duodenal switch 01/24/2018   Morbid obesity (HCC) 07/07/2017    Past Medical History:  Diagnosis Date   Abdominal pain 07/21/2020   Abdominal pain, epigastric    Acute alcohol intoxication (HCC) 10/14/2020   Anxiety    Anxiety disorder, unspecified 10/15/2020   Asthma    Ataxia, unspecified    Bipolar disorder (HCC)    Bipolar II disorder (HCC) 10/15/2020   BMI 40.0-44.9, adult (HCC) 01/24/2018   Headache     History of PCOS    Insomnia    Morbid obesity (HCC) 07/07/2017   Other abnormalities of gait and mobility    Other chronic pain    Status post biliopancreatic diversion with duodenal switch 01/24/2018   Syncope and collapse    Unspecified mood (affective) disorder (HCC) 10/15/2020    Family History  Problem Relation Age of Onset   Colon cancer Mother    Liver cancer Mother    Colon cancer Father    Congestive Heart Failure Father    Diabetes Father    Healthy Sister    Healthy Daughter    Ovarian cysts Daughter    Healthy Daughter    Healthy Daughter    Healthy Daughter    Healthy Daughter    Multiple sclerosis Paternal Aunt    Dementia Paternal Grandmother    Lupus Paternal Grandmother    Healthy Brother    Healthy Son    Breast cancer Neg Hx    Past Surgical History:  Procedure Laterality Date   CESAREAN SECTION     2010 and 2013   CHOLECYSTECTOMY  2019   EYE SURGERY Right    x2 as a  baby   HAND SURGERY Right 2006   LAPAROSCOPIC GASTRIC BANDING     LAPAROSCOPIC REPAIR AND REMOVAL OF GASTRIC BAND     Social History   Social History Narrative   Not on file    There is no immunization history on file for this patient.   Objective: Vital Signs: There  were no vitals taken for this visit.   Physical Exam   Musculoskeletal Exam: ***  CDAI Exam: CDAI Score: -- Patient Global: --; Provider Global: -- Swollen: --; Tender: -- Joint Exam 11/09/2023   No joint exam has been documented for this visit   There is currently no information documented on the homunculus. Go to the Rheumatology activity and complete the homunculus joint exam.  Investigation: No additional findings.  Imaging: No results found.  Recent Labs: Lab Results  Component Value Date   WBC 7.1 10/15/2022   HGB 11.1 (L) 10/15/2022   PLT 310 10/15/2022   NA 139 10/15/2022   K 4.3 10/15/2022   CL 107 10/15/2022   CO2 25 10/15/2022   GLUCOSE 86 10/15/2022   BUN 8 10/15/2022    CREATININE 0.63 10/15/2022   BILITOT 0.4 10/15/2022   ALKPHOS 79 07/31/2020   AST 35 (H) 10/15/2022   ALT 36 (H) 10/15/2022   PROT 6.8 10/15/2022   ALBUMIN 2.8 (L) 07/31/2020   CALCIUM  9.0 10/15/2022    Speciality Comments: No specialty comments available.  Procedures:  No procedures performed Allergies: Bee venom, Codeine, Iodinated contrast media, and Hydromorphone    Assessment / Plan:     Visit Diagnoses: No diagnosis found.  Orders: No orders of the defined types were placed in this encounter.  No orders of the defined types were placed in this encounter.   Face-to-face time spent with patient was *** minutes. Greater than 50% of time was spent in counseling and coordination of care.  Follow-Up Instructions: No follow-ups on file.   Maya Nash, MD  Note - This record has been created using Animal nutritionist.  Chart creation errors have been sought, but may not always  have been located. Such creation errors do not reflect on  the standard of medical care.

## 2023-11-09 ENCOUNTER — Ambulatory Visit: Payer: 59 | Admitting: Rheumatology

## 2023-11-09 DIAGNOSIS — E538 Deficiency of other specified B group vitamins: Secondary | ICD-10-CM

## 2023-11-09 DIAGNOSIS — Z832 Family history of diseases of the blood and blood-forming organs and certain disorders involving the immune mechanism: Secondary | ICD-10-CM

## 2023-11-09 DIAGNOSIS — E559 Vitamin D deficiency, unspecified: Secondary | ICD-10-CM

## 2023-11-09 DIAGNOSIS — R768 Other specified abnormal immunological findings in serum: Secondary | ICD-10-CM

## 2023-11-09 DIAGNOSIS — G894 Chronic pain syndrome: Secondary | ICD-10-CM

## 2023-11-09 DIAGNOSIS — G629 Polyneuropathy, unspecified: Secondary | ICD-10-CM

## 2023-11-09 DIAGNOSIS — Z8709 Personal history of other diseases of the respiratory system: Secondary | ICD-10-CM

## 2023-11-09 DIAGNOSIS — M249 Joint derangement, unspecified: Secondary | ICD-10-CM

## 2023-11-09 DIAGNOSIS — Z8659 Personal history of other mental and behavioral disorders: Secondary | ICD-10-CM

## 2023-11-09 DIAGNOSIS — Z820 Family history of epilepsy and other diseases of the nervous system: Secondary | ICD-10-CM

## 2023-11-09 DIAGNOSIS — G479 Sleep disorder, unspecified: Secondary | ICD-10-CM

## 2023-11-09 DIAGNOSIS — R7989 Other specified abnormal findings of blood chemistry: Secondary | ICD-10-CM

## 2023-12-26 ENCOUNTER — Other Ambulatory Visit: Payer: Self-pay | Admitting: Student

## 2023-12-26 DIAGNOSIS — Z1231 Encounter for screening mammogram for malignant neoplasm of breast: Secondary | ICD-10-CM
